# Patient Record
Sex: Female | Born: 1985 | Race: White | Hispanic: No | Marital: Single | Smoking: Never smoker
Health system: Southern US, Community
[De-identification: ages and names within clinical notes are randomized; demographics above are authoritative.]

## PROBLEM LIST (undated history)

## (undated) ENCOUNTER — Inpatient Hospital Stay (HOSPITAL_COMMUNITY): Payer: Self-pay

## (undated) ENCOUNTER — Emergency Department (HOSPITAL_COMMUNITY)

## (undated) ENCOUNTER — Inpatient Hospital Stay: Payer: Self-pay

## (undated) DIAGNOSIS — G2581 Restless legs syndrome: Secondary | ICD-10-CM

## (undated) DIAGNOSIS — Z62810 Personal history of physical and sexual abuse in childhood: Secondary | ICD-10-CM

## (undated) DIAGNOSIS — Z8751 Personal history of pre-term labor: Secondary | ICD-10-CM

## (undated) HISTORY — DX: Personal history of pre-term labor: Z87.51

## (undated) HISTORY — PX: TONSILLECTOMY: SUR1361

## (undated) HISTORY — PX: WISDOM TOOTH EXTRACTION: SHX21

---
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
## 2004-07-04 ENCOUNTER — Observation Stay: Payer: Self-pay | Admitting: Unknown Physician Specialty

## 2004-08-10 ENCOUNTER — Observation Stay: Payer: Self-pay | Admitting: Obstetrics & Gynecology

## 2004-10-15 ENCOUNTER — Other Ambulatory Visit: Payer: Self-pay

## 2004-10-15 ENCOUNTER — Emergency Department: Payer: Self-pay | Admitting: Emergency Medicine

## 2004-11-07 ENCOUNTER — Emergency Department: Payer: Self-pay | Admitting: Emergency Medicine

## 2006-11-16 ENCOUNTER — Emergency Department: Payer: Self-pay | Admitting: Emergency Medicine

## 2008-04-16 ENCOUNTER — Emergency Department: Payer: Self-pay | Admitting: Emergency Medicine

## 2009-01-02 ENCOUNTER — Emergency Department: Payer: Self-pay | Admitting: Emergency Medicine

## 2009-05-14 ENCOUNTER — Emergency Department: Payer: Self-pay | Admitting: Emergency Medicine

## 2010-04-19 ENCOUNTER — Encounter: Payer: Self-pay | Admitting: Obstetrics and Gynecology

## 2010-05-21 ENCOUNTER — Observation Stay: Payer: Self-pay | Admitting: Obstetrics and Gynecology

## 2010-05-27 ENCOUNTER — Observation Stay: Payer: Self-pay | Admitting: Obstetrics and Gynecology

## 2010-06-04 ENCOUNTER — Observation Stay: Payer: Self-pay

## 2010-06-05 ENCOUNTER — Ambulatory Visit: Payer: Self-pay

## 2010-06-12 ENCOUNTER — Observation Stay: Payer: Self-pay

## 2010-06-28 ENCOUNTER — Observation Stay: Payer: Self-pay | Admitting: Emergency Medicine

## 2010-07-16 ENCOUNTER — Observation Stay: Payer: Self-pay | Admitting: Obstetrics and Gynecology

## 2010-07-22 ENCOUNTER — Inpatient Hospital Stay: Payer: Self-pay

## 2011-02-05 ENCOUNTER — Emergency Department: Payer: Self-pay | Admitting: *Deleted

## 2012-03-04 ENCOUNTER — Emergency Department: Payer: Self-pay | Admitting: Emergency Medicine

## 2012-08-18 ENCOUNTER — Emergency Department: Payer: Self-pay | Admitting: Emergency Medicine

## 2012-10-05 ENCOUNTER — Emergency Department: Payer: Self-pay | Admitting: Emergency Medicine

## 2013-05-06 ENCOUNTER — Emergency Department: Payer: Self-pay | Admitting: Emergency Medicine

## 2014-01-17 HISTORY — PX: BRONCHOSCOPY: SUR163

## 2014-03-27 DIAGNOSIS — Z8659 Personal history of other mental and behavioral disorders: Secondary | ICD-10-CM | POA: Insufficient documentation

## 2014-09-18 ENCOUNTER — Emergency Department
Admission: EM | Admit: 2014-09-18 | Discharge: 2014-09-18 | Disposition: A | Discharge status: Home / Self Care | Payer: Medicaid Other | Attending: Emergency Medicine | Admitting: Emergency Medicine

## 2014-09-18 ENCOUNTER — Emergency Department: Payer: Medicaid Other

## 2014-09-18 ENCOUNTER — Encounter: Payer: Self-pay | Admitting: *Deleted

## 2014-09-18 DIAGNOSIS — M542 Cervicalgia: Secondary | ICD-10-CM | POA: Diagnosis present

## 2014-09-18 DIAGNOSIS — M436 Torticollis: Secondary | ICD-10-CM | POA: Diagnosis not present

## 2014-09-18 DIAGNOSIS — M545 Low back pain: Secondary | ICD-10-CM | POA: Insufficient documentation

## 2014-09-18 DIAGNOSIS — M546 Pain in thoracic spine: Secondary | ICD-10-CM | POA: Insufficient documentation

## 2014-09-18 DIAGNOSIS — G8929 Other chronic pain: Secondary | ICD-10-CM | POA: Insufficient documentation

## 2014-09-18 MED ORDER — METHYLPREDNISOLONE 4 MG PO TBPK: ORAL_TABLET | ORAL | Status: DC

## 2014-09-18 MED ORDER — DEXAMETHASONE SODIUM PHOSPHATE 10 MG/ML IJ SOLN
10.0000 mg | Freq: Once | INTRAMUSCULAR | Status: AC
  Administered 2014-09-18: 10 mg via INTRAMUSCULAR
  Filled 2014-09-18: qty 1

## 2014-09-18 MED ORDER — CYCLOBENZAPRINE HCL 10 MG PO TABS: 10.0000 mg | ORAL_TABLET | Freq: Three times a day (TID) | ORAL | Status: DC | PRN

## 2014-09-18 MED ORDER — ORPHENADRINE CITRATE 30 MG/ML IJ SOLN
60.0000 mg | Freq: Two times a day (BID) | INTRAMUSCULAR | Status: DC
  Administered 2014-09-18: 60 mg via INTRAMUSCULAR
  Filled 2014-09-18: qty 2

## 2014-09-18 MED ORDER — TRAMADOL HCL 50 MG PO TABS: 50.0000 mg | ORAL_TABLET | Freq: Four times a day (QID) | ORAL | Status: DC | PRN

## 2014-09-18 NOTE — ED Notes (Signed)
Pt has neck and upper back pain.  Sx began  3 months ago. No known injury .

## 2014-09-18 NOTE — ED Provider Notes (Signed)
[facility] Emergency Department Provider Note  ____________________________________________  Time seen: Approximately 4:19 PM  I have reviewed the triage vital signs and the nursing notes.   HISTORY  Chief Complaint Torticollis and Back Pain    HPI Lindsay Joseph is a 29 y.o. female patient complaining of 3 months of neck and upper back pain. Patient denies any history of injury. Patient also relates that she's have chronic low back pain and will be seen in the pain clinic on 10/02/2014. Patient states she has decreased range of motion with left lateral movement of the neck. Patient has full right lateral movements of the neck. Patient states that she's had numbness which she all the arms up above her head for extended period of time. Patient denies any loss of strength on upper extremities. Patient is rating the pain as 8/10. Patient stated no palliative measures for this complaint.   No past medical history on file.  There are no active problems to display for this patient.   No past surgical history on file.  Current Outpatient Rx  Name  Route  Sig  Dispense  Refill  . cyclobenzaprine (FLEXERIL) 10 MG tablet   Oral   Take 1 tablet (10 mg total) by mouth every 8 (eight) hours as needed for muscle spasms.   15 tablet   0   . methylPREDNISolone (MEDROL DOSEPAK) 4 MG TBPK tablet      Take Tapered dose as directed   21 tablet   0   . traMADol (ULTRAM) 50 MG tablet   Oral   Take 1 tablet (50 mg total) by mouth every 6 (six) hours as needed for moderate pain.   12 tablet   0     Allergies Review of patient's allergies indicates no known allergies.  No family history on file.  Social History Social History  Substance Use Topics  . Smoking status: Never Smoker   . Smokeless tobacco: Not on file  . Alcohol Use: No    Review of Systems Constitutional: No fever/chills Eyes: No visual changes. ENT: No sore throat. Cardiovascular:  Denies chest pain. Respiratory: Denies shortness of breath. Gastrointestinal: No abdominal pain.  No nausea, no vomiting.  No diarrhea.  No constipation. Genitourinary: Negative for dysuria. Musculoskeletal: Neck and upper back pain.  Skin: Negative for rash. Neurological: Negative for headaches, patient states she gets bilateral upper arm numbness when hands place overhead for extended period of time. 10-point ROS otherwise negative.  ____________________________________________   PHYSICAL EXAM:  VITAL SIGNS: ED Triage Vitals  Enc Vitals Group     BP 09/18/14 1556 131/89 mmHg     Pulse Rate 09/18/14 1556 64     Resp 09/18/14 1556 20     Temp 09/18/14 1556 97.7 F (36.5 C)     Temp Source 09/18/14 1552 Oral     SpO2 09/18/14 1556 97 %     Weight 09/18/14 1556 174 lb (78.926 kg)     Height 09/18/14 1556 5\' 4"  (1.626 m)     Head Cir --      Peak Flow --      Pain Score 09/18/14 1603 8     Pain Loc --      Pain Edu? --      Excl. in GC? --     Constitutional: Alert and oriented. Well appearing and in no acute distress. Eyes: Conjunctivae are normal. PERRL. EOMI. Head: Atraumatic. Nose: No congestion/rhinnorhea. Mouth/Throat: Mucous membranes are moist.  Oropharynx  non-erythematous. Neck: No stridor. No cervical spine tenderness to palpation. Hematological/Lymphatic/Immunilogical: No cervical lymphadenopathy. Cardiovascular: Normal rate, regular rhythm. Grossly normal heart sounds.  Good peripheral circulation. Respiratory: Normal respiratory effort.  No retractions. Lungs CTAB. Gastrointestinal: Soft and nontender. No distention. No abdominal bruits. No CVA tenderness. Musculoskeletal: No lower extremity tenderness nor edema.  No joint effusions. Patient has some moderate guarding palpation of the superior bilateral scapular muscle areas. Neurologic:  Normal speech and language. No gross focal neurologic deficits are appreciated. No gait instability. Skin:  Skin is warm,  dry and intact. No rash noted. Psychiatric: Mood and affect are normal. Speech and behavior are normal.  ____________________________________________   LABS (all labs ordered are listed, but only abnormal results are displayed)  Labs Reviewed - No data to display ____________________________________________  EKG   ____________________________________________  RADIOLOGY  No acute findings   there was straightening of  normal cervical lordosis .I, Joni Reining, personally viewed and evaluated these images (plain radiographs) as part of my medical decision making.   ____________________________________________   PROCEDURES  Procedure(s) performed: None  Critical Care performed: No  ____________________________________________   INITIAL IMPRESSION / ASSESSMENT AND PLAN / ED COURSE  Pertinent labs & imaging results that were available during my care of the patient were reviewed by me and considered in my medical decision making (see chart for details).  Cervical strain. Discussed negative findings x-ray with patient. Patient will be given a prescription for prednisone, Flexeril, and tramadol. ____________________________________________   FINAL CLINICAL IMPRESSION(S) / ED DIAGNOSES  Final diagnoses:  Torticollis, acute      Joni Reining, PA-C 09/18/14 1712  Sharman Cheek, MD 09/18/14 2049

## 2015-06-09 ENCOUNTER — Emergency Department
Admission: EM | Admit: 2015-06-09 | Discharge: 2015-06-09 | Disposition: A | Discharge status: Home / Self Care | Payer: Medicaid Other | Attending: Emergency Medicine | Admitting: Emergency Medicine

## 2015-06-09 ENCOUNTER — Emergency Department: Payer: Medicaid Other

## 2015-06-09 ENCOUNTER — Encounter: Payer: Self-pay | Admitting: Emergency Medicine

## 2015-06-09 DIAGNOSIS — M62838 Other muscle spasm: Secondary | ICD-10-CM | POA: Insufficient documentation

## 2015-06-09 DIAGNOSIS — M542 Cervicalgia: Secondary | ICD-10-CM | POA: Diagnosis present

## 2015-06-09 MED ORDER — KETOROLAC TROMETHAMINE 30 MG/ML IJ SOLN
30.0000 mg | Freq: Once | INTRAMUSCULAR | Status: AC
  Administered 2015-06-09: 30 mg via INTRAMUSCULAR
  Filled 2015-06-09: qty 1

## 2015-06-09 MED ORDER — CYCLOBENZAPRINE HCL 10 MG PO TABS: 10.0000 mg | ORAL_TABLET | Freq: Three times a day (TID) | ORAL | Status: DC | PRN

## 2015-06-09 MED ORDER — NAPROXEN 500 MG PO TABS: 500.0000 mg | ORAL_TABLET | Freq: Two times a day (BID) | ORAL | Status: DC

## 2015-06-09 MED ORDER — TRAMADOL HCL 50 MG PO TABS: 50.0000 mg | ORAL_TABLET | Freq: Four times a day (QID) | ORAL | Status: DC | PRN

## 2015-06-09 NOTE — ED Provider Notes (Signed)
[facility] Emergency Department Provider Note  ____________________________________________  Time seen: Approximately 5:12 PM  I have reviewed the triage vital signs and the nursing notes.   HISTORY  Chief Complaint Neck Pain    HPI Lindsay Joseph is a 30 y.o. female , NAD, presents to the emergency department with four-day history of neck pain. States she was at care once on Friday riding rides with her toddler. States she turned to look at her child and then turn forward and noted her neck strained. Denies any injuries, traumas, falls. Has been taking Tylenol and ibuprofen over-the-counter without alleviation of pain. States her neck feels tight and stiff. Has not had any numbness, weakness, tingling. Does feel the pain is traveling down into her upper back at times. Says it is difficult to sleep at night due to the pain. Has not noted any rashes or skin sores.   History reviewed. No pertinent past medical history.  There are no active problems to display for this patient.   History reviewed. No pertinent past surgical history.  Current Outpatient Rx  Name  Route  Sig  Dispense  Refill  . cyclobenzaprine (FLEXERIL) 10 MG tablet   Oral   Take 1 tablet (10 mg total) by mouth 3 (three) times daily as needed for muscle spasms.   21 tablet   0   . naproxen (NAPROSYN) 500 MG tablet   Oral   Take 1 tablet (500 mg total) by mouth 2 (two) times daily with a meal.   14 tablet   0   . traMADol (ULTRAM) 50 MG tablet   Oral   Take 1 tablet (50 mg total) by mouth every 6 (six) hours as needed.   10 tablet   0     Allergies Review of patient's allergies indicates no known allergies.  No family history on file.  Social History Social History  Substance Use Topics  . Smoking status: Never Smoker   . Smokeless tobacco: None  . Alcohol Use: No     Review of Systems  Constitutional: No fatigue Eyes: No visual changes.  Cardiovascular: No chest  pain. Respiratory:  No shortness of breath. No wheezing.  Gastrointestinal: No abdominal pain.  No nausea, vomiting.   Musculoskeletal: Positive neck and upper back pain. Negative for lower back pain. No extremity pain.  Skin: Negative for rash, bruising, redness, swelling, skin sores. Neurological: Negative for headaches, focal weakness or numbness. No tingling. 10-point ROS otherwise negative.  ____________________________________________   PHYSICAL EXAM:  VITAL SIGNS: ED Triage Vitals  Enc Vitals Group     BP 06/09/15 1640 116/59 mmHg     Pulse Rate 06/09/15 1640 73     Resp 06/09/15 1640 18     Temp 06/09/15 1640 97.8 F (36.6 C)     Temp Source 06/09/15 1640 Oral     SpO2 06/09/15 1640 97 %     Weight 06/09/15 1640 172 lb (78.019 kg)     Height 06/09/15 1640  (1.626 m)     Head Cir --      Peak Flow --      Pain Score 06/09/15 1640 5     Pain Loc --      Pain Edu? --      Excl. in GC? --      Constitutional: Alert and oriented. Well appearing and in no acute distress. Eyes: Conjunctivae are normal. PERRL. EOMI without pain.  Head: Atraumatic. ENT:  Ears:       Nose: No congestion/rhinnorhea.      Mouth/Throat: Mucous membranes are moist.  Neck: No stridor. No cervical spine tenderness to palpation. Bilateral trapezial muscles with moderate spasm and tenderness to palpation. Full range of motion with minimal pain is noted with flexion, right lateral rotation. Decreased range of motion with extension and left lateral rotation due to pain.  Hematological/Lymphatic/Immunilogical: No cervical lymphadenopathy. Cardiovascular: Good peripheral circulation with 2+ pulses noted in bilateral upper extremities. Respiratory: Normal respiratory effort without tachypnea or retractions.  Neurologic:  Normal speech and language. No gross focal neurologic deficits are appreciated. Grip strength is equal in bilateral upper extremities. Skin:  Skin is warm, dry and intact. No  rash noted. Psychiatric: Mood and affect are normal. Speech and behavior are normal. Patient exhibits appropriate insight and judgement.   ____________________________________________   LABS  None ____________________________________________  EKG  None ____________________________________________  RADIOLOGY I have personally viewed and evaluated these images (plain radiographs) as part of my medical decision making, as well as reviewing the written report by the radiologist.  Dg Cervical Spine 2-3 Views  06/09/2015  CLINICAL DATA:  Worsening left-sided neck pain and stiffness. EXAM: CERVICAL SPINE - 2-3 VIEW COMPARISON:  09/18/2014 FINDINGS: There is no evidence of cervical spine fracture or prevertebral soft tissue swelling. There is straightening of the cervical lordosis similar to the prior exam. No other significant bone abnormalities are identified. IMPRESSION: Negative cervical spine radiographs. Straightening of the cervical lordosis, similar than on the prior radiograph. Electronically Signed   By: Ted Mcalpineobrinka  Dimitrova M.D.   On: 06/09/2015 17:32    ____________________________________________    PROCEDURES  Procedure(s) performed: None    Medications  ketorolac (TORADOL) 30 MG/ML injection 30 mg (30 mg Intramuscular Given 06/09/15 1742)     ____________________________________________   INITIAL IMPRESSION / ASSESSMENT AND PLAN / ED COURSE  Pertinent imaging results that were available during my care of the patient were reviewed by me and considered in my medical decision making (see chart for details).  Patient's diagnosis is consistent with muscle spasms of the neck. Patient will be discharged home with prescriptions for Flexeril, Naprosyn, Ultram to take as directed. [facility] or her primary care provider if symptoms persist past this treatment course. Patient is given ED precautions to return to the ED for any worsening  or new symptoms.      ____________________________________________  FINAL CLINICAL IMPRESSION(S) / ED DIAGNOSES  Final diagnoses:  Muscle spasms of neck      NEW MEDICATIONS STARTED DURING THIS VISIT:  New Prescriptions   CYCLOBENZAPRINE (FLEXERIL) 10 MG TABLET    Take 1 tablet (10 mg total) by mouth 3 (three) times daily as needed for muscle spasms.   NAPROXEN (NAPROSYN) 500 MG TABLET    Take 1 tablet (500 mg total) by mouth 2 (two) times daily with a meal.   TRAMADOL (ULTRAM) 50 MG TABLET    Take 1 tablet (50 mg total) by mouth every 6 (six) hours as needed.         Hope PigeonJami L Serra Younan, PA-C 06/09/15 1759  Rockne MenghiniAnne-Caroline Norman, MD 06/10/15 [phone]

## 2015-06-09 NOTE — ED Notes (Signed)
Patient presents to the ED with neck pain since Friday when she was [place] rides [place] [employer].  Patient states this has happened to her before but her pain usually goes away quickly and has not this time.

## 2015-06-09 NOTE — Discharge Instructions (Signed)
Heat Therapy Heat therapy can help ease sore, stiff, injured, and tight muscles and joints. Heat relaxes your muscles, which may help ease your pain. Heat therapy should only be used on old, pre-existing, or long-lasting (chronic) injuries. Do not use heat therapy unless told by your doctor. HOW TO USE HEAT THERAPY There are several different kinds of heat therapy, including:  Moist heat pack.  Warm water bath.  Hot water bottle.  Electric heating pad.  Heated gel pack.  Heated wrap.  Electric heating pad. GENERAL HEAT THERAPY RECOMMENDATIONS   Do not sleep while using heat therapy. Only use heat therapy while you are awake.  Your skin may turn pink while using heat therapy. Do not use heat therapy if your skin turns red.  Do not use heat therapy if you have new pain.  High heat or long exposure to heat can cause burns. Be careful when using heat therapy to avoid burning your skin.  Do not use heat therapy on areas of your skin that are already irritated, such as with a rash or sunburn. GET HELP IF:   You have blisters, redness, swelling (puffiness), or numbness.  You have new pain.  Your pain is worse. MAKE SURE YOU:  Understand these instructions.  Will watch your condition.  Will get help right away if you are not doing well or get worse.   This information is not intended to replace advice given to you by your health care provider. Make sure you discuss any questions you have with your health care provider.   Document Released: 03/28/2011 Document Revised: 01/24/2014 Document Reviewed: 02/26/2013 Elsevier Interactive Patient Education 2016 Elsevier Inc.   Muscle Cramps and Spasms    Muscle cramps and spasms occur when a muscle or muscles tighten and you have no control over this tightening (involuntary muscle contraction). They are a common problem and can develop in any muscle. The most common place is in the calf muscles of the leg. Both muscle cramps and  muscle spasms are involuntary muscle contractions, but they also have differences:  Muscle cramps are sporadic and painful. They may last a few seconds to a quarter of an hour. Muscle cramps are often more forceful and last longer than muscle spasms.  Muscle spasms may or may not be painful. They may also last just a few seconds or much longer. CAUSES  It is uncommon for cramps or spasms to be due to a serious underlying problem. In many cases, the cause of cramps or spasms is unknown. Some common causes are:  Overexertion.  Overuse from repetitive motions (doing the same thing over and over).  Remaining in a certain position for a long period of time.  Improper preparation, form, or technique while performing a sport or activity.  Dehydration.  Injury.  Side effects of some medicines.  Abnormally low levels of the salts and ions in your blood (electrolytes), especially potassium and calcium. This could happen if you are taking water pills (diuretics) or you are pregnant.  Some underlying medical problems can make it more likely to develop cramps or spasms. These include, but are not limited to:  Diabetes.  Parkinson disease.  Hormone disorders, such as thyroid problems.  Alcohol abuse.  Diseases specific to muscles, joints, and bones.  Blood vessel disease where not enough blood is getting to the muscles.  HOME CARE INSTRUCTIONS  Stay well hydrated. Drink enough water and fluids to keep your urine clear or pale yellow.  It may be helpful  to massage, stretch, and relax the affected muscle.  For tight or tense muscles, use a warm towel, heating pad, or hot shower water directed to the affected area.  If you are sore or have pain after a cramp or spasm, applying ice to the affected area may relieve discomfort.  Put ice in a plastic bag.  Place a towel between your skin and the bag.  Leave the ice on for 15-20 minutes, 03-04 times a day. Medicines used to treat a known cause of cramps or  spasms may help reduce their frequency or severity. Only take over-the-counter or prescription medicines as directed by your caregiver. SEEK MEDICAL CARE IF:  Your cramps or spasms get more severe, more frequent, or do not improve over time.  MAKE SURE YOU:  Understand these instructions.  Will watch your condition.  Will get help right away if you are not doing well or get worse. This information is not intended to replace advice given to you by your health care provider. Make sure you discuss any questions you have with your health care provider.  Document Released: 06/25/2001 Document Revised: 04/30/2012 Document Reviewed: 12/21/2011  Elsevier Interactive Patient Education [employer]

## 2015-06-09 NOTE — ED Notes (Signed)
See triage note   Having neck pain since Friday  Denies any falls or trauma. States pain started after [place] [employer]

## 2015-06-16 ENCOUNTER — Other Ambulatory Visit: Payer: Self-pay | Admitting: Advanced Practice Midwife

## 2015-06-16 DIAGNOSIS — N61 Mastitis without abscess: Secondary | ICD-10-CM

## 2015-06-23 ENCOUNTER — Ambulatory Visit: Admission: RE | Admit: 2015-06-23 | Payer: Medicaid Other | Source: Ambulatory Visit

## 2015-06-23 ENCOUNTER — Other Ambulatory Visit: Payer: Medicaid Other

## 2016-04-05 LAB — HM HIV SCREENING LAB: HM HIV Screening: NEGATIVE

## 2016-04-05 LAB — HM PAP SMEAR: HM Pap smear: NEGATIVE

## 2016-09-15 ENCOUNTER — Encounter: Payer: Self-pay | Admitting: Advanced Practice Midwife

## 2016-09-15 ENCOUNTER — Ambulatory Visit (INDEPENDENT_AMBULATORY_CARE_PROVIDER_SITE_OTHER): Payer: Medicaid Other | Admitting: Advanced Practice Midwife

## 2016-09-15 VITALS — BP 116/70 | Wt 165.0 lb

## 2016-09-15 DIAGNOSIS — Z124 Encounter for screening for malignant neoplasm of cervix: Secondary | ICD-10-CM

## 2016-09-15 DIAGNOSIS — O0990 Supervision of high risk pregnancy, unspecified, unspecified trimester: Secondary | ICD-10-CM | POA: Diagnosis not present

## 2016-09-15 DIAGNOSIS — Z113 Encounter for screening for infections with a predominantly sexual mode of transmission: Secondary | ICD-10-CM

## 2016-09-15 NOTE — Progress Notes (Signed)
NOB today.  

## 2016-09-16 LAB — RPR+RH+ABO+RUB AB+AB SCR+CB...
ANTIBODY SCREEN: NEGATIVE
HEMATOCRIT: 36 % (ref 34.0–46.6)
HIV Screen 4th Generation wRfx: NONREACTIVE
Hemoglobin: 12 g/dL (ref 11.1–15.9)
Hepatitis B Surface Ag: NEGATIVE
MCH: 28.2 pg (ref 26.6–33.0)
MCHC: 33.3 g/dL (ref 31.5–35.7)
MCV: 85 fL (ref 79–97)
Platelets: 200 10*3/uL (ref 150–379)
RBC: 4.26 x10E6/uL (ref 3.77–5.28)
RDW: 13.7 % (ref 12.3–15.4)
RH TYPE: POSITIVE
RPR Ser Ql: NONREACTIVE
Rubella Antibodies, IGG: 1.42 index (ref >0.99)
Varicella zoster IgG: 1331 index (ref >165)
WBC: 6.6 10*3/uL (ref 3.4–10.8)

## 2016-09-16 NOTE — Patient Instructions (Signed)

## 2016-09-16 NOTE — Progress Notes (Signed)
New Obstetric Patient H&P    Chief Complaint: "Desires prenatal care"   History of Present Illness: Patient is a 31 y.o. [id number] Not Hispanic or Latino female, LMP uncertain presents with amenorrhea and positive home pregnancy test. Based on her date of conception, her EDD is Estimated Date of Delivery: 04/25/2017. and her EGA is 10854w3d. Cycles are 14. days, regular, and occur approximately every : 28 days. The patient thinks she has never had a PAP smear. She has never initiated prenatal care prior to 33 weeks.   She had a urine pregnancy test which was positive 3 or 4 week(s)  ago. Her last menstrual period was normal and lasted for  2 week(s). Since her LMP she claims she has experienced breast tenderness, fatigue, nausea. She denies vaginal bleeding. Her past medical history is noncontributory. Her prior pregnancies are notable for preterm labor and delivery. Discussion of progesterone supplement. Patient requests 17P beginning at 16 weeks.  Since her LMP, she admits to the use of tobacco products  no She claims she has gained   5 pounds since the start of her pregnancy.  There are cats in the home in the home  no  She admits close contact with children on a regular basis  yes  She has had chicken pox in the past yes She has had Tuberculosis exposures, symptoms, or previously tested positive for TB   no Current or past history of domestic violence. no  Genetic Screening/Teratology Counseling: (Includes patient, baby's father, or anyone in either family with:)   1. [facility]'s [facility]/= 3[facility]  no 2. Thalassemia ([place], [place], Mediterranean, or Asian background): MCV<80  no 3. Neural tube defect (meningomyelocele, spina bifida, anencephaly)  no 4. Congenital heart defect  no  5. Down syndrome  no 6. Tay-Sachs (Jewish, [place])  no 7. Canavan's Disease  no 8. Sickle cell disease or trait (African)  no  9. Hemophilia or other blood disorders  no  10. Muscular dystrophy  no    11. Cystic fibrosis  no  12. Huntington's Chorea  no  13. Mental retardation/autism  no 14. Other inherited genetic or chromosomal disorder  no 15. Maternal metabolic disorder (DM, PKU, etc)  no 16. Patient or FOB with a child with a birth defect not listed above no  16a. Patient or FOB with a birth defect themselves no 17. Recurrent pregnancy loss, or stillbirth  no  18. Any medications since LMP other than prenatal vitamins (include vitamins, supplements, OTC meds, drugs, alcohol)  no 19. Any other genetic/environmental exposure to discuss  no  Infection History:   1. Lives with someone with TB or TB exposed  no  2. Patient or partner has history of genital herpes  no 3. Rash or viral illness since LMP  no 4. History of STI (GC, CT, HPV, syphilis, HIV)  yes 5. History of recent travel :  no  Other pertinent information:  no    Review of Systems:10 point review of systems negative unless otherwise noted in HPI  Past Medical History:  History reviewed. No pertinent past medical history.  Past Surgical History:  Past Surgical History:  Procedure Laterality Date  . TONSILLECTOMY      Gynecologic History: Uncertain LMP  Obstetric History: [id number]  Family History:  Family History  Problem Relation Age of Onset  . Throat cancer Father     Social History:  Social History   Social History  . Marital status: Single  Spouse name: N/A  . Number of children: N/A  . Years of education: N/A   Occupational History  . Not on file.   Social History Main Topics  . Smoking status: Never Smoker  . Smokeless tobacco: Never Used  . Alcohol use No  . Drug use: No  . Sexual activity: Yes    Birth control/ protection: None   Other Topics Concern  . Not on file   Social History Narrative  . No narrative on file    Allergies:  No Known Allergies  Medications: Prior to Admission medications   Medication Sig Start Date End Date Taking? Authorizing Provider   cyclobenzaprine (FLEXERIL) 10 MG tablet Take 1 tablet (10 mg total) by mouth 3 (three) times daily as needed for muscle spasms. Patient not taking: Reported on 09/15/2016 06/09/15   Hagler, Jami L, PA-C  naproxen (NAPROSYN) 500 MG tablet Take 1 tablet (500 mg total) by mouth 2 (two) times daily with a meal. Patient not taking: Reported on 09/15/2016 06/09/15   Hagler, Jami L, PA-C  traMADol (ULTRAM) 50 MG tablet Take 1 tablet (50 mg total) by mouth every 6 (six) hours as needed. Patient not taking: Reported on 09/15/2016 06/09/15   Hope Pigeon, PA-C    Physical Exam Vitals: Blood pressure 116/70, weight 165 lb (74.8 kg), last menstrual period 08/02/2016.  General: NAD HEENT: normocephalic, anicteric Thyroid: no enlargement, no palpable nodules Pulmonary: No increased work of breathing, CTAB Cardiovascular: RRR, distal pulses 2+ Abdomen: NABS, soft, non-tender, non-distended.  Umbilicus without lesions.  No hepatomegaly, splenomegaly or masses palpable. No evidence of hernia  Genitourinary:  External: Normal external female genitalia.  Normal urethral meatus, normal  Bartholin's and Skene's glands.    Vagina: Normal vaginal mucosa, no evidence of prolapse.    Cervix: Grossly normal in appearance, no bleeding, no CMT  Uterus: Enlarged, mobile, normal contour.    Adnexa: ovaries non-enlarged, no adnexal masses  Rectal: deferred Extremities: no edema, erythema, or tenderness Neurologic: Grossly intact Psychiatric: mood appropriate, affect full   Assessment: 31 y.o. [id number] at Unknown presenting to initiate prenatal care  Plan: 1) Avoid alcoholic beverages. 2) Patient encouraged not to smoke.  3) Discontinue the use of all non-medicinal drugs and chemicals.  4) Take prenatal vitamins daily.  5) Nutrition, food safety (fish, cheese advisories, and high nitrite foods) and exercise discussed. 6) Hospital and practice style discussed with cross coverage system.  7) Genetic Screening, such  as with 1st Trimester Screening, cell free fetal DNA, AFP testing, and Ultrasound, as well as with amniocentesis and CVS as appropriate, is discussed with patient. At the conclusion of today's visit patient requested genetic testing 8) Patient is asked about travel to areas at risk for the Zika virus, and counseled to avoid travel and exposure to mosquitoes or sexual partners who may have themselves been exposed to the virus. Testing is discussed, and will be ordered as appropriate.  9) 17P beginning at 16 weeks  Tresea Mall, CNM

## 2016-09-17 LAB — URINE CULTURE

## 2016-09-22 LAB — IGP,CTNGTV,APT HPV,RFX16/18,45
CHLAMYDIA, NUC. ACID AMP: NEGATIVE
GONOCOCCUS, NUC. ACID AMP: NEGATIVE
HPV Aptima: NEGATIVE
PAP SMEAR COMMENT: 0
TRICH VAG BY NAA: NEGATIVE

## 2016-09-23 ENCOUNTER — Other Ambulatory Visit: Payer: Self-pay

## 2016-09-23 ENCOUNTER — Encounter: Payer: Self-pay | Admitting: Advanced Practice Midwife

## 2016-09-26 ENCOUNTER — Encounter: Payer: Self-pay | Admitting: Maternal Newborn

## 2016-09-26 ENCOUNTER — Ambulatory Visit (INDEPENDENT_AMBULATORY_CARE_PROVIDER_SITE_OTHER): Payer: Medicaid Other

## 2016-09-26 DIAGNOSIS — O0990 Supervision of high risk pregnancy, unspecified, unspecified trimester: Secondary | ICD-10-CM

## 2016-09-26 DIAGNOSIS — Z362 Encounter for other antenatal screening follow-up: Secondary | ICD-10-CM | POA: Diagnosis not present

## 2016-10-14 ENCOUNTER — Encounter: Payer: Self-pay | Admitting: *Deleted

## 2016-10-14 ENCOUNTER — Emergency Department
Admission: EM | Admit: 2016-10-14 | Discharge: 2016-10-14 | Disposition: A | Discharge status: Home / Self Care | Payer: Medicaid Other | Attending: Emergency Medicine | Admitting: Emergency Medicine

## 2016-10-14 ENCOUNTER — Emergency Department: Payer: Medicaid Other

## 2016-10-14 DIAGNOSIS — Z79899 Other long term (current) drug therapy: Secondary | ICD-10-CM | POA: Diagnosis not present

## 2016-10-14 DIAGNOSIS — R1084 Generalized abdominal pain: Secondary | ICD-10-CM | POA: Diagnosis present

## 2016-10-14 DIAGNOSIS — O9989 Other specified diseases and conditions complicating pregnancy, childbirth and the puerperium: Secondary | ICD-10-CM | POA: Insufficient documentation

## 2016-10-14 DIAGNOSIS — R102 Pelvic and perineal pain: Secondary | ICD-10-CM | POA: Insufficient documentation

## 2016-10-14 DIAGNOSIS — R109 Unspecified abdominal pain: Secondary | ICD-10-CM

## 2016-10-14 DIAGNOSIS — O26899 Other specified pregnancy related conditions, unspecified trimester: Secondary | ICD-10-CM

## 2016-10-14 LAB — COMPREHENSIVE METABOLIC PANEL
ALBUMIN: 3.4 g/dL — AB (ref 3.5–5.0)
ALK PHOS: 27 U/L — AB (ref 38–126)
ALT: 8 U/L — ABNORMAL LOW (ref 14–54)
ANION GAP: 9 (ref 5–15)
AST: 13 U/L — AB (ref 15–41)
BILIRUBIN TOTAL: 0.3 mg/dL (ref 0.3–1.2)
BUN: 11 mg/dL (ref 6–20)
CALCIUM: 9 mg/dL (ref 8.9–10.3)
CHLORIDE: 103 mmol/L (ref 101–111)
CO2: 23 mmol/L (ref 22–32)
CREATININE: 0.63 mg/dL (ref 0.44–1.00)
Glucose, Bld: 87 mg/dL (ref 65–99)
Potassium: 3.6 mmol/L (ref 3.5–5.1)
SODIUM: 135 mmol/L (ref 135–145)
Total Protein: 6.5 g/dL (ref 6.5–8.1)

## 2016-10-14 LAB — URINALYSIS, COMPLETE (UACMP) WITH MICROSCOPIC
BACTERIA UA: NONE SEEN
Bilirubin Urine: NEGATIVE
Glucose, UA: NEGATIVE mg/dL
Ketones, ur: NEGATIVE mg/dL
Nitrite: NEGATIVE
PH: 5 (ref 5.0–8.0)
Protein, ur: NEGATIVE mg/dL
SPECIFIC GRAVITY, URINE: 1.021 (ref 1.005–1.030)

## 2016-10-14 LAB — CBC WITH DIFFERENTIAL/PLATELET
BASOS PCT: 1 %
Basophils Absolute: 0 10*3/uL (ref 0–0.1)
EOS ABS: 0 10*3/uL (ref 0–0.7)
Eosinophils Relative: 1 %
HCT: 35.3 % (ref 35.0–47.0)
HEMOGLOBIN: 12.2 g/dL (ref 12.0–16.0)
LYMPHS ABS: 2.1 10*3/uL (ref 1.0–3.6)
Lymphocytes Relative: 27 %
MCH: 29.4 pg (ref 26.0–34.0)
MCHC: 34.7 g/dL (ref 32.0–36.0)
MCV: 84.6 fL (ref 80.0–100.0)
Monocytes Absolute: 0.6 10*3/uL (ref 0.2–0.9)
Monocytes Relative: 8 %
NEUTROS PCT: 63 %
Neutro Abs: 5 10*3/uL (ref 1.4–6.5)
Platelets: 194 10*3/uL (ref 150–440)
RBC: 4.17 MIL/uL (ref 3.80–5.20)
RDW: 12.9 % (ref 11.5–14.5)
WBC: 7.8 10*3/uL (ref 3.6–11.0)

## 2016-10-14 LAB — WET PREP, GENITAL
Clue Cells Wet Prep HPF POC: NONE SEEN
Sperm: NONE SEEN
Trich, Wet Prep: NONE SEEN
Yeast Wet Prep HPF POC: NONE SEEN

## 2016-10-14 LAB — POCT PREGNANCY, URINE: PREG TEST UR: POSITIVE — AB

## 2016-10-14 LAB — CHLAMYDIA/NGC RT PCR (ARMC ONLY)
CHLAMYDIA TR: NOT DETECTED
N GONORRHOEAE: NOT DETECTED

## 2016-10-14 LAB — HCG, QUANTITATIVE, PREGNANCY: HCG, BETA CHAIN, QUANT, S: 68866 m[IU]/mL — AB (ref <5)

## 2016-10-14 MED ORDER — SODIUM CHLORIDE 0.9 % IV BOLUS (SEPSIS)
1000.0000 mL | Freq: Once | INTRAVENOUS | Status: AC
  Administered 2016-10-14: 1000 mL via INTRAVENOUS

## 2016-10-14 NOTE — ED Notes (Signed)
Pt presents at [redacted] weeks pregnant with abdominal cramping that feels "like a charley horse." Pt state that she has difficulty with pregnancy in general (this is her 8th pregnancy; miscarriage in December), usually starting to dilate at 20 weeks. Pt alert & oriented with NAD noted.

## 2016-10-14 NOTE — ED Triage Notes (Signed)
Pt is 13 weeks pregnancy with pregnancy #8, pt complains of abdominal cramping starting yesterday, pt denies vaginal bleeding

## 2016-10-14 NOTE — ED Provider Notes (Signed)
[facility] Emergency Department Provider Note ____________________________________________   I have reviewed the triage vital signs and the triage nursing note.  HISTORY  Chief Complaint Abdominal Pain   Historian Patient  HPI Lindsay Joseph is a 31 y.o. female presents [redacted] weeks pregnant, following with [facility], states abdominal/pelvic cramping overnight.  No vaginal bleeding or vaginal fluid loss. No upper abdominal pain. States that when she urinates at the and it feels like it hurts, but not previously diagnosed with urinary tract infection.  Prior pregnancy problems of early cervical dilation around 20 weeks.  Pain is moderate. States that Tylenol never works for her.  she's had 8 pregnancies. She has had four premature births. She had one term birth at 20 weeks which did not live. An she's had 2 additional miscarriages around 8 weeks.    History reviewed. No pertinent past medical history.  There are no active problems to display for this patient.   Past Surgical History:  Procedure Laterality Date  . TONSILLECTOMY      Prior to Admission medications   Medication Sig Start Date End Date Taking? Authorizing Provider  cyclobenzaprine (FLEXERIL) 10 MG tablet Take 1 tablet (10 mg total) by mouth 3 (three) times daily as needed for muscle spasms. Patient not taking: Reported on 09/15/2016 06/09/15   Hagler, Jami L, PA-C  naproxen (NAPROSYN) 500 MG tablet Take 1 tablet (500 mg total) by mouth 2 (two) times daily with a meal. Patient not taking: Reported on 09/15/2016 06/09/15   Hagler, Jami L, PA-C  traMADol (ULTRAM) 50 MG tablet Take 1 tablet (50 mg total) by mouth every 6 (six) hours as needed. Patient not taking: Reported on 09/15/2016 06/09/15   Hagler, Jami L, PA-C    No Known Allergies  Family History  Problem Relation Age of Onset  . Throat cancer Father     Social History Social History  Substance Use Topics  . Smoking status:  Never Smoker  . Smokeless tobacco: Never Used  . Alcohol use No    Review of Systems  Constitutional: Negative for fever. Eyes: Negative for visual changes. ENT: Negative for sore throat. Cardiovascular: Negative for chest pain. Respiratory: Negative for shortness of breath. Gastrointestinal: Negative for vomiting and diarrhea. Genitourinary: Negative for dysuria. Musculoskeletal: Positive for back pain. Skin: Negative for rash. Neurological: Negative for headache.  ____________________________________________   PHYSICAL EXAM:  VITAL SIGNS: ED Triage Vitals  Enc Vitals Group     BP 10/14/16 0743 117/73     Pulse Rate 10/14/16 0743 66     Resp 10/14/16 0743 18     Temp 10/14/16 0743 97.6 F (36.4 C)     Temp Source 10/14/16 0743 Oral     SpO2 10/14/16 0743 99 %     Weight 10/14/16 0743 165 lb (74.8 kg)     Height 10/14/16 0743  (1.626 m)     Head Circumference --      Peak Flow --      Pain Score 10/14/16 0741 4     Pain Loc --      Pain Edu? --      Excl. in GC? --      Constitutional: Alert and oriented. Well appearing and in no distress. HEENT   Head: Normocephalic and atraumatic.      Eyes: Conjunctivae are normal. Pupils equal and round.       Ears:         Nose: No congestion/rhinnorhea.  Mouth/Throat: Mucous membranes are moist.   Neck: No stridor. Cardiovascular/Chest: Normal rate, regular rhythm.  No murmurs, rubs, or gallops. Respiratory: Normal respiratory effort without tachypnea nor retractions. Breath sounds are clear and equal bilaterally. No wheezes/rales/rhonchi. Gastrointestinal: Soft. No distention, no guarding, no rebound. Mild tenderness lower abdomen.  Uterus at edge of pelvis.  No focal rlq pain.  No upper abdominal pain.  Genitourinary/rectal: closed cervix.  No cervicitis or mass on bimanual.  No bleeding, no discharge. Musculoskeletal: Nontender with normal range of motion in all extremities. No joint effusions.  No lower  extremity tenderness.  No edema. Neurologic:  Normal speech and language. No gross or focal neurologic deficits are appreciated. Skin:  Skin is warm, dry and intact. No rash noted. Psychiatric: Mood and affect are normal. Speech and behavior are normal. Patient exhibits appropriate insight and judgment.   ____________________________________________  LABS (pertinent positives/negatives) I, Governor Rooks, MD the attending physician have reviewed the labs noted below.  Labs Reviewed  WET PREP, GENITAL - Abnormal; Notable for the following:       Result Value   WBC, Wet Prep HPF POC MODERATE (*)    All other components within normal limits  URINALYSIS, COMPLETE (UACMP) WITH MICROSCOPIC - Abnormal; Notable for the following:    Color, Urine YELLOW (*)    APPearance HAZY (*)    Hgb urine dipstick SMALL (*)    Leukocytes, UA TRACE (*)    Squamous Epithelial / LPF 6-30 (*)    All other components within normal limits  COMPREHENSIVE METABOLIC PANEL - Abnormal; Notable for the following:    Albumin 3.4 (*)    AST 13 (*)    ALT 8 (*)    Alkaline Phosphatase 27 (*)    All other components within normal limits  HCG, QUANTITATIVE, PREGNANCY - Abnormal; Notable for the following:    hCG, Beta Chain, Quant, [place] 21,308 (*)    All other components within normal limits  POCT PREGNANCY, URINE - Abnormal; Notable for the following:    Preg Test, Ur POSITIVE (*)    All other components within normal limits  CHLAMYDIA/NGC RT PCR ([facility] ONLY)  CBC WITH DIFFERENTIAL/PLATELET    ____________________________________________    EKG I, Governor Rooks, MD, the attending physician have personally viewed and interpreted all ECGs.  None ____________________________________________  RADIOLOGY All Xrays were viewed by me.  Imaging interpreted by Radiologist, and I, Governor Rooks, MD the attending physician have reviewed the radiologist interpretation noted below.  US ob and transvaginal:   FINDINGS: Intrauterine gestational sac: Single  Yolk sac:  Not Visualized.  Embryo:  Visualized.  Cardiac Activity: Visualized.  Heart Rate: 157 bpm  CRL:   64  mm   12 w 5 d                  [place] EDC: 04/23/2017  Subchorionic hemorrhage:  None visualized.  Maternal uterus/adnexae: Normal appearance of both ovaries. No mass or abnormal free fluid identified.  IMPRESSION: Single living IUP with assigned gestational age is currently 12 weeks 3 days. Appropriate fetal growth.  No significant maternal uterine or adnexal abnormality identified.  __________________________________________  PROCEDURES  Procedure(s) performed: None  Critical Care performed: None  ____________________________________________  No current facility-administered medications on file prior to encounter.    Current Outpatient Prescriptions on File Prior to Encounter  Medication Sig Dispense Refill  . cyclobenzaprine (FLEXERIL) 10 MG tablet Take 1 tablet (10 mg total) by mouth 3 (three) times daily as needed  for muscle spasms. (Patient not taking: Reported on 09/15/2016) 21 tablet 0  . naproxen (NAPROSYN) 500 MG tablet Take 1 tablet (500 mg total) by mouth 2 (two) times daily with a meal. (Patient not taking: Reported on 09/15/2016) 14 tablet 0  . traMADol (ULTRAM) 50 MG tablet Take 1 tablet (50 mg total) by mouth every 6 (six) hours as needed. (Patient not taking: Reported on 09/15/2016) 10 tablet 0    ____________________________________________  ED COURSE / ASSESSMENT AND PLAN  Pertinent labs & imaging results that were available during my care of the patient were reviewed by me and considered in my medical decision making (see chart for details).   Lindsay Joseph is here with pelvic cramping, soft abdomen on exam. She is mainly concerned about the pregnancy given her previous issues with miscarriages and premature births. She states that normally her trouble starts with incompetent cervix  around 20 weeks.  On pelvic exam her cervix is closed. Ultrasound is reassuring.  Laboratory studies are reassuring. I don't have a suspicion for other emergency intra-abdominal cause at this point time. We discussed reassuring exam and evaluation and she does have follow-up with an OB/GYN.   DIFFERENTIAL DIAGNOSIS: Differential diagnosis includes, but is not limited to, ovarian cyst, ovarian torsion, acute appendicitis, diverticulitis, urinary tract infection/pyelonephritis, endometriosis, bowel obstruction, colitis, renal colic, gastroenteritis, hernia, pregnancy related pain including threatened miscarriage, incomplete miscarriage, subchorionic hemorrhage/hematoma, etc.  CONSULTATIONS:   None   Patient / Family / Caregiver informed of clinical course, medical decision-making process, and agree with plan.   I discussed return precautions, follow-up instructions, and discharge instructions with patient and/or family.  Discharge Instructions : You were evaluated for lower abdominal cramping and pain, and although no certain cause was found, your exam and evaluation are overall reassuring in the emergency department today.  Return to the emergency department for any vaginal bleeding, new or worsening abdominal pain, fever, or any other symptoms concerning to you.  ___________________________________________   FINAL CLINICAL IMPRESSION(S) / ED DIAGNOSES   Final diagnoses:  Pelvic cramping              Note: This dictation was prepared with Dragon dictation. Any transcriptional errors that result from this process are unintentional    Governor Rooks, MD 10/14/16 1148

## 2016-10-14 NOTE — Discharge Instructions (Signed)
You were evaluated for lower abdominal cramping and pain, and although no certain cause was found, your exam and evaluation are overall reassuring in the emergency department today.  Return to the emergency department for any vaginal bleeding, new or worsening abdominal pain, fever, or any other symptoms concerning to you.

## 2016-10-20 ENCOUNTER — Telehealth: Payer: Self-pay

## 2016-10-20 NOTE — Telephone Encounter (Signed)
CM made telephone contact with pt today.  CM introduced self and purpose of contact.  CM discussed with pt the importance keeping and scheduling routine appointments with her [facility] Provider office.  Pt discussed that "I have a lot of going on right now, I work third shift, and I am in the process of moving."  Pt reports that her new address is [address] in [place].  Pt reports that she does not get much sleep since working third shift.  Pt appears to be disengaged in a conversation.  CM offered assistance to pt regarding her needs through the Pregnancy Care Management Program.  CM asked pt when would be a good time for this cm to meet with pt face to face.  Pt responded, "I don't need and don't want your services right now."  CM encouraged pt again to schedule a routine OB appointment and encouraged pt to contact this cm if services are needed.  Pt. Hung up the phone.

## 2016-11-08 ENCOUNTER — Encounter: Payer: Medicaid Other | Admitting: Obstetrics and Gynecology

## 2016-11-10 ENCOUNTER — Ambulatory Visit (INDEPENDENT_AMBULATORY_CARE_PROVIDER_SITE_OTHER): Payer: Medicaid Other | Admitting: Maternal Newborn

## 2016-11-10 ENCOUNTER — Encounter: Payer: Self-pay | Admitting: Maternal Newborn

## 2016-11-10 DIAGNOSIS — O2622 Pregnancy care for patient with recurrent pregnancy loss, second trimester: Secondary | ICD-10-CM

## 2016-11-10 DIAGNOSIS — O0990 Supervision of high risk pregnancy, unspecified, unspecified trimester: Secondary | ICD-10-CM | POA: Insufficient documentation

## 2016-11-10 NOTE — Progress Notes (Signed)
    Routine Prenatal Care Visit  Subjective  Lindsay Joseph is a 31 y.o. [id number] at 6655w2d being seen today for ongoing prenatal care.  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  ----------------------------------------------------------------------------------- Patient reports fatigue and tightness/soreness in lower abdomen and dizziness/faintness when moving from seated to standing position.   Vag. Bleeding: None. Denies leaking of fluid.  ----------------------------------------------------------------------------------- The following portions of the patient's history were reviewed and updated as appropriate: allergies, current medications, past family history, past medical history, past social history, past surgical history and problem list. Problem list updated.   Objective  Blood pressure (!) 102/54, weight 159 lb (72.1 kg), last menstrual period 08/02/2016. Pregravid weight Pregravid weight not on file Total Weight Gain Not found. Urinalysis: Urine Protein: Trace Urine Glucose: Negative  Fetal Status: Fetal Heart Rate (bpm): 150         General:  Alert, oriented and cooperative. Patient is in no acute distress.  Skin: Skin is warm and dry. No rash noted.   Cardiovascular: Normal heart rate noted  Respiratory: Normal respiratory effort, no problems with respiration noted  Abdomen: Soft, gravid, appropriate for gestational age. Pain/Pressure: Absent     Pelvic:  Cervical exam deferred        Extremities: Normal range of motion.     Mental Status: Normal mood and affect. Normal behavior. Normal judgment and thought content.     Assessment   31 y.o. [id number] at 9655w2d, EDD 04/25/2017 by Ultrasound presenting for routine prenatal visit.  Plan   pregnancy Problems (from 09/15/16 to present)    Problem Noted Resolved   High risk pregnancy due to recurrent  pregnancy loss 11/10/2016 by Oswaldo ConroySchmid, Gerold Sar Y, CNM No    Discussed postural hypotension vs. Anemia as causes for patient's symptoms; CBC drawn to check for anemia as she reports symptoms as daily and moderate severity.  Letter given for dentist to allow her to get tooth extractions.  Desires note for work to be excused for remainder of pregnancy due to sx and risk of PTL, will discuss with MD.  Preterm labor symptoms and general obstetric precautions including but not limited to vaginal bleeding, contractions, leaking of fluid and fetal movement were reviewed in detail with the patient.  Return in about 4 weeks (around 12/08/2016) for ROB.  Marcelyn BruinsJacelyn Shadoe Cryan, CNM 11/10/2016  5:13 PM

## 2016-11-11 ENCOUNTER — Other Ambulatory Visit: Payer: Self-pay | Admitting: Maternal Newborn

## 2016-11-11 ENCOUNTER — Telehealth: Payer: Self-pay | Admitting: Maternal Newborn

## 2016-11-11 DIAGNOSIS — O09212 Supervision of pregnancy with history of pre-term labor, second trimester: Secondary | ICD-10-CM

## 2016-11-11 LAB — CBC WITH DIFFERENTIAL
BASOS ABS: 0 10*3/uL (ref 0.0–0.2)
Basos: 0 %
EOS (ABSOLUTE): 0.1 10*3/uL (ref 0.0–0.4)
Eos: 1 %
Hematocrit: 34 % (ref 34.0–46.6)
Hemoglobin: 11.5 g/dL (ref 11.1–15.9)
IMMATURE GRANS (ABS): 0 10*3/uL (ref 0.0–0.1)
IMMATURE GRANULOCYTES: 0 %
LYMPHS: 26 %
Lymphocytes Absolute: 1.9 10*3/uL (ref 0.7–3.1)
MCH: 29.1 pg (ref 26.6–33.0)
MCHC: 33.8 g/dL (ref 31.5–35.7)
MCV: 86 fL (ref 79–97)
Monocytes Absolute: 0.5 10*3/uL (ref 0.1–0.9)
Monocytes: 7 %
NEUTROS PCT: 66 %
Neutrophils Absolute: 4.9 10*3/uL (ref 1.4–7.0)
RBC: 3.95 x10E6/uL (ref 3.77–5.28)
RDW: 14 % (ref 12.3–15.4)
WBC: 7.4 10*3/uL (ref 3.4–10.8)

## 2016-11-11 MED ORDER — HYDROXYPROGESTERONE CAPROATE 250 MG/ML IM OIL: 250.0000 mg | TOPICAL_OIL | INTRAMUSCULAR | 19 refills | Status: DC

## 2016-11-11 NOTE — Telephone Encounter (Signed)
Please advise 

## 2016-11-11 NOTE — Telephone Encounter (Signed)
Pt is calling About a note taking her out of work please advise.

## 2016-11-18 ENCOUNTER — Telehealth: Payer: Self-pay | Admitting: Obstetrics and Gynecology

## 2016-11-18 NOTE — Telephone Encounter (Signed)
Lvm for patient to call back to be schedule for TVUS at next appt.

## 2016-11-25 ENCOUNTER — Ambulatory Visit: Payer: Medicaid Other

## 2016-11-28 ENCOUNTER — Ambulatory Visit: Payer: Medicaid Other

## 2016-12-13 ENCOUNTER — Encounter: Payer: Medicaid Other | Admitting: Obstetrics and Gynecology

## 2016-12-13 ENCOUNTER — Ambulatory Visit (INDEPENDENT_AMBULATORY_CARE_PROVIDER_SITE_OTHER): Payer: Medicaid Other | Admitting: Maternal Newborn

## 2016-12-13 ENCOUNTER — Encounter: Payer: Self-pay | Admitting: Maternal Newborn

## 2016-12-13 VITALS — BP 100/60 | Wt 161.0 lb

## 2016-12-13 DIAGNOSIS — O2622 Pregnancy care for patient with recurrent pregnancy loss, second trimester: Secondary | ICD-10-CM

## 2016-12-13 DIAGNOSIS — R109 Unspecified abdominal pain: Secondary | ICD-10-CM

## 2016-12-13 DIAGNOSIS — O26899 Other specified pregnancy related conditions, unspecified trimester: Secondary | ICD-10-CM

## 2016-12-13 LAB — POCT URINALYSIS DIPSTICK
Bilirubin, UA: NEGATIVE
Blood, UA: NEGATIVE
Glucose, UA: NEGATIVE
Ketones, UA: NEGATIVE
LEUKOCYTES UA: NEGATIVE
NITRITE UA: NEGATIVE
PROTEIN UA: NEGATIVE
UROBILINOGEN UA: NEGATIVE U/dL — AB
pH, UA: 5 (ref 5.0–8.0)

## 2016-12-13 NOTE — Progress Notes (Signed)
    Prenatal Care - Problem Visit  Subjective  Lindsay Joseph is a 31 y.o. [id number] at 4615w0d being seen today for ongoing prenatal care.  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  ----------------------------------------------------------------------------------- Patient reports that she began having abdominal pain around 3 am with nausea and gas. She felt bloated and had sharp, intermittent pains in her stomach.  She had one episode of diarrhea and felt better afterwards. At this time, her stomach is still somewhat uncomfortable and feels tight, but seems to be improving. Contractions: Not present. Vag. Bleeding: None.   . Denies leaking of fluid.  ----------------------------------------------------------------------------------- The following portions of the patient's history were reviewed and updated as appropriate: allergies, current medications, past family history, past medical history, past social history, past surgical history and problem list. Problem list updated.   Objective  Blood pressure 100/60, weight 161 lb (73 kg), last menstrual period 08/02/2016. Pregravid weight Pregravid weight not on file Total Weight Gain Not found. Urinalysis:      Fetal Status: Fetal Heart Rate (bpm): 145         General:  Alert, oriented and cooperative. Patient is in no acute distress.  Skin: Skin is warm and dry. No rash noted.   Cardiovascular: Normal heart rate noted  Respiratory: Normal respiratory effort, no problems with respiration noted  Abdomen: Soft, gravid, appropriate for gestational age, non-tender to palpation but generalized pain persists. Pain/Pressure: Present     Pelvic:  Cervical exam performed Dilation: Closed Effacement (%): Thick Station: Ballotable  Extremities: Normal range of motion.     Mental Status: Normal mood and affect. Normal behavior. Normal  judgment and thought content.     Assessment   31 y.o. [id number] at 4715w0d, EDD 04/25/2017 by Ultrasound, presenting for prenatal problem visit.  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 No symptoms of preterm labor. Patient encouraged to go to triage if she develops more worrisome pattern of symptoms. Otherwise, advised rest and bland diet today, gradually increasing from clear liquids.  Preterm labor symptoms and general obstetric precautions including but not limited to vaginal bleeding, contractions, leaking of fluid and fetal movement were reviewed in detail with the patient.  Return if symptoms worsen or fail to improve. Keep ROB appointment on 11/30 with ultrasound.  Marcelyn BruinsJacelyn Zarai Orsborn, CNM 12/13/2016  1:44 PM

## 2016-12-16 ENCOUNTER — Ambulatory Visit (INDEPENDENT_AMBULATORY_CARE_PROVIDER_SITE_OTHER): Payer: Medicaid Other | Admitting: Advanced Practice Midwife

## 2016-12-16 ENCOUNTER — Encounter: Payer: Self-pay | Admitting: Advanced Practice Midwife

## 2016-12-16 ENCOUNTER — Ambulatory Visit (INDEPENDENT_AMBULATORY_CARE_PROVIDER_SITE_OTHER): Payer: Medicaid Other

## 2016-12-16 VITALS — BP 100/60 | Wt 160.0 lb

## 2016-12-16 DIAGNOSIS — O0990 Supervision of high risk pregnancy, unspecified, unspecified trimester: Secondary | ICD-10-CM

## 2016-12-16 DIAGNOSIS — Z3A21 21 weeks gestation of pregnancy: Secondary | ICD-10-CM

## 2016-12-16 DIAGNOSIS — O09212 Supervision of pregnancy with history of pre-term labor, second trimester: Secondary | ICD-10-CM | POA: Diagnosis not present

## 2016-12-16 DIAGNOSIS — Z362 Encounter for other antenatal screening follow-up: Secondary | ICD-10-CM

## 2016-12-16 NOTE — Progress Notes (Signed)
  Routine Prenatal Care Visit  Subjective  Lindsay Joseph is a 31 y.o. [id number] at 1048w3d being seen today for ongoing prenatal care.  She is currently monitored for the following issues for this high-risk pregnancy and has Supervision of high risk pregnancy, antepartum on their problem list.  ----------------------------------------------------------------------------------- Patient reports feeling braxton hicks and pressure in abdomen.   Denies vaginal bleeding. Denies leaking of fluid. Denies contractions. ----------------------------------------------------------------------------------- The following portions of the patient's history were reviewed and updated as appropriate: allergies, current medications, past family history, past medical history, past social history, past surgical history and problem list. Problem list updated.   Objective  Blood pressure 100/60, weight 160 lb (72.6 kg), last menstrual period 08/02/2016. Pregravid weight Pregravid weight not on file Total Weight Gain Not found. Urinalysis: Urine Protein: Trace Urine Glucose: Negative  Fetal Status: movement present TVUS today: cervix is 3.9 cm and does not change with fundal pressure  General:  Alert, oriented and cooperative. Patient is in no acute distress.  Skin: Skin is warm and dry. No rash noted.   Cardiovascular: Normal heart rate noted  Respiratory: Normal respiratory effort, no problems with respiration noted  Abdomen: Soft, gravid, appropriate for gestational age. Pain/Pressure: Present     Pelvic:  Cervical exam deferred        Extremities: Normal range of motion.     Mental Status: Normal mood and affect. Normal behavior. Normal judgment and thought content.   Assessment   31 y.o. [id number] at 6948w3d by  04/25/2017, by Ultrasound presenting for routine prenatal visit  Plan   pregnancy Problems (from 09/15/16 to present)    Problem Noted Resolved   Supervision of high risk pregnancy, antepartum  11/10/2016 by Oswaldo ConroySchmid, Jacelyn Y, CNM No   Overview Signed 12/13/2016  1:59 PM by Oswaldo ConroySchmid, Jacelyn Y, CNM    Clinic [facility] Prenatal Labs  Dating 09/26/2016 ultrasound (9w 6d) Blood type: A/Positive/-- (08/30 1411)   Genetic Screen 1 Screen:    AFP:     Quad:     NIPS: Antibody:Negative (08/30 1411)  Anatomic US  Rubella: 1.42 (08/30 1411) Varicella: Immune  GTT Early:               Third trimester:  RPR: Non Reactive (08/30 1411)   Rhogam  HBsAg: Negative (08/30 1411)   TDaP vaccine                       Flu Shot: HIV:     Baby Food                                GBS:   Contraception  Pap:  CBB     CS/VBAC    Support Person                  Preterm labor symptoms and general obstetric precautions including but not limited to vaginal bleeding, contractions, leaking of fluid and fetal movement were reviewed in detail with the patient.  Return in about 1 week (around 12/23/2016) for anatomy scan and rob.  Tresea MallJane Toshi Ishii, CNM  12/16/2016 3:10 PM

## 2016-12-16 NOTE — Progress Notes (Signed)
C/o a lot of cramps.rj

## 2016-12-23 ENCOUNTER — Ambulatory Visit (INDEPENDENT_AMBULATORY_CARE_PROVIDER_SITE_OTHER): Payer: Medicaid Other

## 2016-12-23 ENCOUNTER — Ambulatory Visit (INDEPENDENT_AMBULATORY_CARE_PROVIDER_SITE_OTHER): Payer: Medicaid Other | Admitting: Advanced Practice Midwife

## 2016-12-23 ENCOUNTER — Encounter: Payer: Self-pay | Admitting: Advanced Practice Midwife

## 2016-12-23 VITALS — BP 100/66 | Wt 162.0 lb

## 2016-12-23 DIAGNOSIS — Z362 Encounter for other antenatal screening follow-up: Secondary | ICD-10-CM

## 2016-12-23 DIAGNOSIS — O0990 Supervision of high risk pregnancy, unspecified, unspecified trimester: Secondary | ICD-10-CM

## 2016-12-23 DIAGNOSIS — IMO0002 Reserved for concepts with insufficient information to code with codable children: Secondary | ICD-10-CM

## 2016-12-23 DIAGNOSIS — Z3A22 22 weeks gestation of pregnancy: Secondary | ICD-10-CM

## 2016-12-23 DIAGNOSIS — Z0489 Encounter for examination and observation for other specified reasons: Secondary | ICD-10-CM

## 2016-12-23 NOTE — Progress Notes (Signed)
No complaints f/u anatomy today

## 2016-12-23 NOTE — Progress Notes (Signed)
  Routine Prenatal Care Visit  Subjective  Malachy Moodrica D Boffa is a 31 y.o. [id number] at 4752w3d being seen today for ongoing prenatal care.  She is currently monitored for the following issues for this high-risk pregnancy and has Supervision of high risk pregnancy, antepartum on their problem list.  ----------------------------------------------------------------------------------- Patient reports no complaints.   Denies contractions. Denies vaginal bleeding. Denies leaking of fluid.  ----------------------------------------------------------------------------------- The following portions of the patient's history were reviewed and updated as appropriate: allergies, current medications, past family history, past medical history, past social history, past surgical history and problem list. Problem list updated.   Objective  Blood pressure 100/66, weight 162 lb (73.5 kg), last menstrual period 08/02/2016. Pregravid weight Pregravid weight not on file Total Weight Gain Not found. Urinalysis:      Fetal Status: movement present Anatomy scan today incomplete for heart, N/L, profile, abdominal cord insertion  General:  Alert, oriented and cooperative. Patient is in no acute distress.  Skin: Skin is warm and dry. No rash noted.   Cardiovascular: Normal heart rate noted  Respiratory: Normal respiratory effort, no problems with respiration noted  Abdomen: Soft, gravid, appropriate for gestational age.       Pelvic:  Cervical exam deferred        Extremities: Normal range of motion.     Mental Status: Normal mood and affect. Normal behavior. Normal judgment and thought content.   Assessment   31 y.o. [id number] at 6352w3d by  04/25/2017, by Ultrasound presenting for routine prenatal visit  Plan   pregnancy Problems (from 09/15/16 to present)    Problem Noted Resolved   Supervision of high risk pregnancy, antepartum 11/10/2016 by Oswaldo ConroySchmid, Jacelyn Y, CNM No   Overview Signed 12/13/2016  1:59 PM by  Oswaldo ConroySchmid, Jacelyn Y, CNM    Clinic [facility] Prenatal Labs  Dating 09/26/2016 ultrasound (9w 6d) Blood type: A/Positive/-- (08/30 1411)   Genetic Screen 1 Screen:    AFP:     Quad:     NIPS: Antibody:Negative (08/30 1411)  Anatomic US  Rubella: 1.42 (08/30 1411) Varicella: Immune  GTT Early:               Third trimester:  RPR: Non Reactive (08/30 1411)   Rhogam  HBsAg: Negative (08/30 1411)   TDaP vaccine                       Flu Shot: HIV:     Baby Food                                GBS:   Contraception  Pap:  CBB     CS/VBAC    Support Person                  Preterm labor symptoms and general obstetric precautions including but not limited to vaginal bleeding, contractions, leaking of fluid and fetal movement were reviewed in detail with the patient.   Return in about 4 weeks (around 01/20/2017) for follow up anatomy, cervical length and rob.  Tresea MallJane Graceanna Theissen, CNM  12/23/2016 4:45 PM

## 2016-12-27 ENCOUNTER — Encounter: Payer: Self-pay | Admitting: Advanced Practice Midwife

## 2016-12-27 ENCOUNTER — Ambulatory Visit (INDEPENDENT_AMBULATORY_CARE_PROVIDER_SITE_OTHER): Payer: Medicaid Other | Admitting: Advanced Practice Midwife

## 2016-12-27 VITALS — BP 90/60 | Wt 163.0 lb

## 2016-12-27 DIAGNOSIS — O26899 Other specified pregnancy related conditions, unspecified trimester: Secondary | ICD-10-CM

## 2016-12-27 DIAGNOSIS — Z3A23 23 weeks gestation of pregnancy: Secondary | ICD-10-CM

## 2016-12-27 DIAGNOSIS — R109 Unspecified abdominal pain: Secondary | ICD-10-CM

## 2016-12-27 NOTE — Progress Notes (Signed)
C/o woke up to streaks of blood in discharge when she wiped this morning.  States it's gone now.  ? Mucus plug.  No intercourse since preg.  Cramping and didn't feel good at all yesterday, restless leg syndrome was crazy.  FM 4x in the last 8hrs.

## 2016-12-27 NOTE — Progress Notes (Signed)
Routine Prenatal Care Visit  Subjective  Lindsay Joseph is a [age] y.o. [id number] at 4646w0d being seen today for ongoing prenatal care.  She is currently monitored for the following issues for this high-risk pregnancy and has Supervision of high risk pregnancy, antepartum on their problem list.  ----------------------------------------------------------------------------------- Patient reports streak of blood when wiping this morning at 7 am. Deberah PeltonBraxton Hicks contractions and some cramping especially yesterday. Restless leg yesterday. Feeling of light headed. Has felt baby move 4 times since last night. She denies recent intercourse. She admits to drinking h2o but is unsure if it is an adequate amount. She admits to eating protein but may not be getting enough. She is taking a prenatal vitamin.  Contractions: Not present.  .  Movement: (!) Decreased. Denies leaking of fluid.  ----------------------------------------------------------------------------------- The following portions of the patient's history were reviewed and updated as appropriate: allergies, current medications, past family history, past medical history, past social history, past surgical history and problem list. Problem list updated.   Objective  Blood pressure 90/60, weight 163 lb (73.9 kg), last menstrual period 08/02/2016. Pregravid weight Pregravid weight not on file Total Weight Gain Not found. Urinalysis:      Fetal Status: Fetal Heart Rate (bpm): 145   Movement: (!) Decreased     General:  Alert, oriented and cooperative. Patient is in no acute distress.  Skin: Skin is warm and dry. No rash noted.   Cardiovascular: Normal heart rate noted  Respiratory: Normal respiratory effort, no problems with respiration noted  Abdomen: Soft, gravid, appropriate for gestational age. Pain/Pressure: Absent     Pelvic:  Cervical exam performed FT/thick/ballotable, no change from exam 4 days ago.   Extremities: Normal range of motion.       Mental Status: Normal mood and affect. Normal behavior. Normal judgment and thought content.   Assessment   31 y.o. E3P2951G8P0434 at 6946w0d by  04/25/2017, by Ultrasound presenting for work-in prenatal visit  Plan   pregnancy Problems (from 09/15/16 to present)    Problem Noted Resolved   Supervision of high risk pregnancy, antepartum 11/10/2016 by Oswaldo ConroySchmid, Jacelyn Y, CNM No   Overview Signed 12/13/2016  1:59 PM by Oswaldo ConroySchmid, Jacelyn Y, CNM    [facility] Prenatal Labs  Dating 09/26/2016 ultrasound (9w 6d) Blood type: A/Positive/-- (08/30 1411)   Genetic Screen 1 Screen:    AFP:     Quad:     NIPS: Antibody:Negative (08/30 1411)  Anatomic US  Rubella: 1.42 (08/30 1411) Varicella: Immune  GTT Early:               Third trimester:  RPR: Non Reactive (08/30 1411)   Rhogam  HBsAg: Negative (08/30 1411)   TDaP vaccine                       Flu Shot: HIV:     Baby Food                                GBS:   Contraception  Pap:  CBB     CS/VBAC    Support Person                  Preterm labor symptoms and general obstetric precautions including but not limited to vaginal bleeding, contractions, leaking of fluid and fetal movement were reviewed in detail with the patient. Patient is encouraged to increase hydration, increase protein intake,  take a magnesium supplement, soak in tub with epsom salts.    Return for has f/u appt already scheduled.  Lindsay Joseph, CNM  12/27/2016 11:12 AM

## 2017-01-08 ENCOUNTER — Emergency Department (HOSPITAL_COMMUNITY): Payer: Medicaid Other

## 2017-01-08 ENCOUNTER — Encounter (HOSPITAL_COMMUNITY): Payer: Self-pay

## 2017-01-08 ENCOUNTER — Inpatient Hospital Stay (HOSPITAL_COMMUNITY)
Admission: EM | Admit: 2017-01-08 | Discharge: 2017-01-09 | Disposition: A | Discharge status: Home / Self Care | Payer: Medicaid Other | Attending: Obstetrics & Gynecology | Admitting: Obstetrics & Gynecology

## 2017-01-08 DIAGNOSIS — Z3A22 22 weeks gestation of pregnancy: Secondary | ICD-10-CM | POA: Insufficient documentation

## 2017-01-08 DIAGNOSIS — O9A212 Injury, poisoning and certain other consequences of external causes complicating pregnancy, second trimester: Secondary | ICD-10-CM | POA: Diagnosis not present

## 2017-01-08 DIAGNOSIS — O0990 Supervision of high risk pregnancy, unspecified, unspecified trimester: Secondary | ICD-10-CM

## 2017-01-08 LAB — CBC
HEMATOCRIT: 33.3 % — AB (ref 36.0–46.0)
HEMOGLOBIN: 11.2 g/dL — AB (ref 12.0–15.0)
MCH: 29 pg (ref 26.0–34.0)
MCHC: 33.6 g/dL (ref 30.0–36.0)
MCV: 86.3 fL (ref 78.0–100.0)
Platelets: 192 10*3/uL (ref 150–400)
RBC: 3.86 MIL/uL — ABNORMAL LOW (ref 3.87–5.11)
RDW: 12.4 % (ref 11.5–15.5)
WBC: 9 10*3/uL (ref 4.0–10.5)

## 2017-01-08 LAB — I-STAT CHEM 8, ED
BUN: 6 mg/dL (ref 6–20)
CALCIUM ION: 1.07 mmol/L — AB (ref 1.15–1.40)
CREATININE: 0.5 mg/dL (ref 0.44–1.00)
Chloride: 105 mmol/L (ref 101–111)
GLUCOSE: 82 mg/dL (ref 65–99)
HCT: 32 % — ABNORMAL LOW (ref 36.0–46.0)
Hemoglobin: 10.9 g/dL — ABNORMAL LOW (ref 12.0–15.0)
Potassium: 3.4 mmol/L — ABNORMAL LOW (ref 3.5–5.1)
SODIUM: 138 mmol/L (ref 135–145)
TCO2: 20 mmol/L — AB (ref 22–32)

## 2017-01-08 LAB — COMPREHENSIVE METABOLIC PANEL
ALT: 10 U/L — ABNORMAL LOW (ref 14–54)
ANION GAP: 9 (ref 5–15)
AST: 15 U/L (ref 15–41)
Albumin: 2.9 g/dL — ABNORMAL LOW (ref 3.5–5.0)
Alkaline Phosphatase: 48 U/L (ref 38–126)
BILIRUBIN TOTAL: 0.6 mg/dL (ref 0.3–1.2)
BUN: 5 mg/dL — ABNORMAL LOW (ref 6–20)
CALCIUM: 8.4 mg/dL — AB (ref 8.9–10.3)
CO2: 18 mmol/L — ABNORMAL LOW (ref 22–32)
Chloride: 106 mmol/L (ref 101–111)
Creatinine, Ser: 0.56 mg/dL (ref 0.44–1.00)
Glucose, Bld: 80 mg/dL (ref 65–99)
POTASSIUM: 3.3 mmol/L — AB (ref 3.5–5.1)
Sodium: 133 mmol/L — ABNORMAL LOW (ref 135–145)
TOTAL PROTEIN: 5.8 g/dL — AB (ref 6.5–8.1)

## 2017-01-08 LAB — SAMPLE TO BLOOD BANK

## 2017-01-08 LAB — PROTIME-INR
INR: 1
PROTHROMBIN TIME: 13.1 s (ref 11.4–15.2)

## 2017-01-08 LAB — I-STAT CG4 LACTIC ACID, ED: LACTIC ACID, VENOUS: 0.9 mmol/L (ref 0.5–1.9)

## 2017-01-08 LAB — CDS SEROLOGY

## 2017-01-08 LAB — ETHANOL

## 2017-01-08 MED ORDER — SODIUM CHLORIDE 0.9 % IV SOLN
INTRAVENOUS | Status: AC | PRN
  Administered 2017-01-08: 1000 mL via INTRAVENOUS
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
## 2017-01-08 NOTE — MAU Note (Signed)
[facility].  Denies LOF or bleeding. +FM.
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
## 2017-01-08 NOTE — ED Notes (Signed)
Called [facility] to have pt transported to [facility]U
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
## 2017-01-09 ENCOUNTER — Inpatient Hospital Stay (HOSPITAL_COMMUNITY): Payer: Medicaid Other

## 2017-01-09 DIAGNOSIS — O9A212 Injury, poisoning and certain other consequences of external causes complicating pregnancy, second trimester: Secondary | ICD-10-CM | POA: Diagnosis present

## 2017-01-09 MED ORDER — CYCLOBENZAPRINE HCL 10 MG PO TABS
10.0000 mg | ORAL_TABLET | Freq: Once | ORAL | Status: AC
  Administered 2017-01-09: 10 mg via ORAL
  Filled 2017-01-09: qty 1

## 2017-01-09 MED ORDER — CYCLOBENZAPRINE HCL 5 MG PO TABS: 5.0000 mg | ORAL_TABLET | Freq: Three times a day (TID) | ORAL | 0 refills | Status: DC | PRN
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
## 2017-01-20 ENCOUNTER — Other Ambulatory Visit: Payer: Self-pay | Admitting: Obstetrics and Gynecology

## 2017-01-20 ENCOUNTER — Encounter: Payer: Self-pay | Admitting: Obstetrics and Gynecology

## 2017-01-20 ENCOUNTER — Ambulatory Visit (INDEPENDENT_AMBULATORY_CARE_PROVIDER_SITE_OTHER): Payer: Medicaid Other

## 2017-01-20 ENCOUNTER — Ambulatory Visit (INDEPENDENT_AMBULATORY_CARE_PROVIDER_SITE_OTHER): Payer: Medicaid Other | Admitting: Obstetrics and Gynecology

## 2017-01-20 VITALS — BP 102/62 | Wt 158.0 lb

## 2017-01-20 DIAGNOSIS — O09212 Supervision of pregnancy with history of pre-term labor, second trimester: Secondary | ICD-10-CM

## 2017-01-20 DIAGNOSIS — Z362 Encounter for other antenatal screening follow-up: Secondary | ICD-10-CM | POA: Diagnosis not present

## 2017-01-20 DIAGNOSIS — O0993 Supervision of high risk pregnancy, unspecified, third trimester: Secondary | ICD-10-CM

## 2017-01-20 DIAGNOSIS — O0990 Supervision of high risk pregnancy, unspecified, unspecified trimester: Secondary | ICD-10-CM | POA: Diagnosis not present

## 2017-01-20 DIAGNOSIS — O09219 Supervision of pregnancy with history of pre-term labor, unspecified trimester: Secondary | ICD-10-CM

## 2017-01-20 DIAGNOSIS — Z3A26 26 weeks gestation of pregnancy: Secondary | ICD-10-CM

## 2017-01-20 DIAGNOSIS — IMO0002 Reserved for concepts with insufficient information to code with codable children: Secondary | ICD-10-CM

## 2017-01-20 DIAGNOSIS — O09892 Supervision of other high risk pregnancies, second trimester: Secondary | ICD-10-CM

## 2017-01-20 DIAGNOSIS — Z0489 Encounter for examination and observation for other specified reasons: Secondary | ICD-10-CM | POA: Diagnosis not present

## 2017-01-20 DIAGNOSIS — O09899 Supervision of other high risk pregnancies, unspecified trimester: Secondary | ICD-10-CM | POA: Insufficient documentation
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
## 2017-02-03 ENCOUNTER — Other Ambulatory Visit: Payer: Medicaid Other

## 2017-02-03 ENCOUNTER — Encounter: Payer: Medicaid Other | Admitting: Obstetrics & Gynecology

## 2017-02-27 ENCOUNTER — Other Ambulatory Visit: Payer: Self-pay | Admitting: Certified Nurse Midwife

## 2017-02-27 ENCOUNTER — Other Ambulatory Visit: Payer: Self-pay

## 2017-02-27 ENCOUNTER — Observation Stay
Admission: EM | Admit: 2017-02-27 | Discharge: 2017-02-27 | Disposition: A | Discharge status: Home / Self Care | Payer: Medicaid Other | Attending: Certified Nurse Midwife | Admitting: Certified Nurse Midwife

## 2017-02-27 DIAGNOSIS — O09213 Supervision of pregnancy with history of pre-term labor, third trimester: Secondary | ICD-10-CM

## 2017-02-27 DIAGNOSIS — O9A212 Injury, poisoning and certain other consequences of external causes complicating pregnancy, second trimester: Secondary | ICD-10-CM

## 2017-02-27 DIAGNOSIS — Z3A31 31 weeks gestation of pregnancy: Secondary | ICD-10-CM

## 2017-02-27 DIAGNOSIS — O0990 Supervision of high risk pregnancy, unspecified, unspecified trimester: Secondary | ICD-10-CM

## 2017-02-27 DIAGNOSIS — O09893 Supervision of other high risk pregnancies, third trimester: Secondary | ICD-10-CM

## 2017-02-27 DIAGNOSIS — O26893 Other specified pregnancy related conditions, third trimester: Secondary | ICD-10-CM | POA: Diagnosis not present

## 2017-02-27 DIAGNOSIS — O4703 False labor before 37 completed weeks of gestation, third trimester: Secondary | ICD-10-CM

## 2017-02-27 DIAGNOSIS — O4700 False labor before 37 completed weeks of gestation, unspecified trimester: Secondary | ICD-10-CM | POA: Diagnosis present

## 2017-02-27 HISTORY — DX: Restless legs syndrome: G25.81

## 2017-02-27 HISTORY — DX: Personal history of physical and sexual abuse in childhood: Z62.810

## 2017-02-27 LAB — URINALYSIS, COMPLETE (UACMP) WITH MICROSCOPIC
BACTERIA UA: NONE SEEN
BILIRUBIN URINE: NEGATIVE
Glucose, UA: NEGATIVE mg/dL
KETONES UR: NEGATIVE mg/dL
NITRITE: NEGATIVE
Protein, ur: NEGATIVE mg/dL
SPECIFIC GRAVITY, URINE: 1.023 (ref 1.005–1.030)
pH: 5 (ref 5.0–8.0)

## 2017-02-27 LAB — CHLAMYDIA/NGC RT PCR (ARMC ONLY)
CHLAMYDIA TR: NOT DETECTED
N gonorrhoeae: NOT DETECTED

## 2017-02-27 MED ORDER — LACTATED RINGERS IV SOLN
500.0000 mL | Freq: Once | INTRAVENOUS | Status: AC
  Administered 2017-02-27: 500 mL via INTRAVENOUS

## 2017-02-27 MED ORDER — TERBUTALINE SULFATE 1 MG/ML IJ SOLN
0.2500 mg | Freq: Once | INTRAMUSCULAR | Status: AC
  Administered 2017-02-27: 0.25 mg via SUBCUTANEOUS

## 2017-02-27 MED ORDER — BETAMETHASONE SOD PHOS & ACET 6 (3-3) MG/ML IJ SUSP
12.0000 mg | INTRAMUSCULAR | Status: DC
  Administered 2017-02-27: 12 mg via INTRAMUSCULAR

## 2017-02-27 MED ORDER — BETAMETHASONE SOD PHOS & ACET 6 (3-3) MG/ML IJ SUSP
INTRAMUSCULAR | Status: AC
  Filled 2017-02-27: qty 1

## 2017-02-27 MED ORDER — TERBUTALINE SULFATE 1 MG/ML IJ SOLN
INTRAMUSCULAR | Status: AC
  Filled 2017-02-27: qty 1

## 2017-02-27 MED ORDER — NIFEDIPINE 10 MG PO CAPS: 10.0000 mg | ORAL_CAPSULE | Freq: Four times a day (QID) | ORAL | 1 refills | Status: DC

## 2017-02-27 MED ORDER — LACTATED RINGERS IV SOLN: INTRAVENOUS | Status: DC

## 2017-02-27 MED ORDER — NIFEDIPINE 10 MG PO CAPS: 10.0000 mg | ORAL_CAPSULE | Freq: Four times a day (QID) | ORAL | Status: DC

## 2017-02-27 MED ORDER — NIFEDIPINE 10 MG PO CAPS
ORAL_CAPSULE | ORAL | Status: AC
Start: 2017-02-27 — End: 2017-02-27
  Administered 2017-02-27: 10 mg
  Filled 2017-02-27: qty 1

## 2017-02-27 NOTE — Discharge Summary (Signed)
See final progress note 02/27/2017  Lindsay Joseph, CNM

## 2017-02-27 NOTE — Final Progress Note (Signed)
Physician Final Progress Note  Patient ID: Lindsay Joseph MRN: [id number] DOB/AGE: 33/02/1985 34 y.o.  Admit date: 02/27/2017 Admitting provider: Vena AustriaAndreas Staebler, MD/ Trinna Balloonolleen l. Desi Rowe, CNM Discharge date: 02/27/2017   Admission Diagnoses: Threatened preterm labor at 31wk6days History of preterm labor and delivery x 4  Discharge Diagnoses: same as above  Consults: None  Significant Findings/ Diagnostic Studies: Lindsay Moodrica D Guterrez is a 32 y.o. G4W1027G8P0434 [facility] 04[facility]25/2017 at 4445w6d dated by a 9wk6d ultrasound.  Her pregnancy has been complicated by five preterm deliveries (one was a second trimester loss at 20 weeks).   She opted not to have 17 P injections. She presents to L&D for evaluation of lower abdominal pressure and cramping that started at 0600 this Am and had a bloody show at 0700.  She was contracting every 2-3 minutes on arrival and her cervix was 1.5/50-60%/-1. An ultrasound confirmed the baby was in cephalic presentation (ROA). Wet prep was negative. UA was negative except being concentrated with the sp gravity of 1.023. GC/Chlamydia cultures pending. FHR was reactive. Had a difficulty with keeping baby on the monitor because she has restless leg syndrome and had difficulty being in one position for very long. She was given an IV bolus and  terbutaline 0.25 mgm  subcutaneously x2 and the contractions spaced out. Recheck of her cervix one hour after arrival was unchanged except baby was at -2 station and the cervix was less effaced. She was also given betamethasone 12 mgm IM and begun on Procardia 10 mgm po q6 hours. Four hours after arrival the cervix was unchanged (1.5/25-50%/-2 and ballotable). She requested to be discharged so she could go home and soak in a hot tub for her restless legs. She was essentially acontractile and she was discharged.    Procedures: none  Discharge Condition: stable  Disposition: 01-Home or Self Care  Diet: Regular diet  Discharge Activity:  Pelvic rest/ Modified bedrest this next week. No prolonged standing or walking.   Discharge Instructions    Discontinue IV   Complete by:  As directed      Allergies as of 02/27/2017   No Known Allergies     Medication List    TAKE these medications   cyclobenzaprine 5 MG tablet Commonly known as:  FLEXERIL Take 1 tablet (5 mg total) by mouth 3 (three) times daily as needed for muscle spasms.   NIFEdipine 10 MG capsule Commonly known as:  PROCARDIA Take 1 capsule (10 mg total) by mouth every 6 (six) hours.   prenatal multivitamin Tabs tablet Take 1 tablet by mouth daily at 12 noon.      Follow up: RTN to L&D in AM for second betamethasone injection.  ROB/ 28 week labs/ and [facility].  Total time spent taking care of this patient: 30 minutes  Signed: Farrel Connersolleen Markella Dao , CNM 02/27/2017, 1:05 PM
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
## 2017-02-27 NOTE — OB Triage Note (Signed)
Pt G8P4 complains of vaginal bleeding since 0700 this morning. Pt states "bright red bleeding at 0700, but changed her underwear. Since has only wiped and seen dark brown blood on toilet paper." Pt states abdominal pain since 0600 this morning. Said they were an 8/10 in pain, but has decreased in intensity and now rates it a 0/10. Pt states no sex since she conceived and has been keeping herself hydrated. VSS. Monitors applied and assessing.

## 2017-02-28 ENCOUNTER — Observation Stay
Admission: RE | Admit: 2017-02-28 | Discharge: 2017-02-28 | Disposition: A | Discharge status: Home / Self Care | Payer: Medicaid Other | Attending: Obstetrics and Gynecology | Admitting: Obstetrics and Gynecology

## 2017-02-28 DIAGNOSIS — G2581 Restless legs syndrome: Secondary | ICD-10-CM | POA: Insufficient documentation

## 2017-02-28 DIAGNOSIS — Z3A32 32 weeks gestation of pregnancy: Secondary | ICD-10-CM | POA: Diagnosis not present

## 2017-02-28 DIAGNOSIS — Z3A31 31 weeks gestation of pregnancy: Secondary | ICD-10-CM | POA: Diagnosis not present

## 2017-02-28 DIAGNOSIS — O26893 Other specified pregnancy related conditions, third trimester: Secondary | ICD-10-CM | POA: Diagnosis not present

## 2017-02-28 DIAGNOSIS — Z79899 Other long term (current) drug therapy: Secondary | ICD-10-CM | POA: Insufficient documentation

## 2017-02-28 LAB — CULTURE, BETA STREP (GROUP B ONLY)

## 2017-02-28 MED ORDER — BETAMETHASONE SOD PHOS & ACET 6 (3-3) MG/ML IJ SUSP
12.0000 mg | Freq: Once | INTRAMUSCULAR | Status: AC
  Administered 2017-02-28: 12 mg via INTRAMUSCULAR

## 2017-02-28 MED ORDER — BETAMETHASONE SOD PHOS & ACET 6 (3-3) MG/ML IJ SUSP
INTRAMUSCULAR | Status: AC
  Filled 2017-02-28: qty 1

## 2017-02-28 MED ORDER — BETAMETHASONE SOD PHOS & ACET 6 (3-3) MG/ML IJ SUSP
INTRAMUSCULAR | Status: AC
  Administered 2017-02-28: 12 mg via INTRAMUSCULAR
  Filled 2017-02-28: qty 1
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
## 2017-02-28 NOTE — Discharge Summary (Signed)
Pt came to receive her 2nd dose of betamethasone. Shot given. No questions from patient. Reviewed discharge instructions with patient. Pt discharged home.

## 2017-03-01 ENCOUNTER — Ambulatory Visit (INDEPENDENT_AMBULATORY_CARE_PROVIDER_SITE_OTHER): Payer: Medicaid Other

## 2017-03-01 ENCOUNTER — Other Ambulatory Visit: Payer: Medicaid Other

## 2017-03-01 ENCOUNTER — Ambulatory Visit (INDEPENDENT_AMBULATORY_CARE_PROVIDER_SITE_OTHER): Payer: Medicaid Other | Admitting: Certified Nurse Midwife

## 2017-03-01 VITALS — BP 120/60 | Wt 160.0 lb

## 2017-03-01 DIAGNOSIS — O368130 Decreased fetal movements, third trimester, not applicable or unspecified: Secondary | ICD-10-CM

## 2017-03-01 DIAGNOSIS — O0990 Supervision of high risk pregnancy, unspecified, unspecified trimester: Secondary | ICD-10-CM | POA: Diagnosis not present

## 2017-03-01 DIAGNOSIS — Z23 Encounter for immunization: Secondary | ICD-10-CM

## 2017-03-01 DIAGNOSIS — Z3A32 32 weeks gestation of pregnancy: Secondary | ICD-10-CM | POA: Diagnosis not present

## 2017-03-01 DIAGNOSIS — O09899 Supervision of other high risk pregnancies, unspecified trimester: Secondary | ICD-10-CM

## 2017-03-01 DIAGNOSIS — O288 Other abnormal findings on antenatal screening of mother: Secondary | ICD-10-CM

## 2017-03-01 DIAGNOSIS — O09219 Supervision of pregnancy with history of pre-term labor, unspecified trimester: Secondary | ICD-10-CM

## 2017-03-01 DIAGNOSIS — G2581 Restless legs syndrome: Secondary | ICD-10-CM | POA: Insufficient documentation

## 2017-03-01 DIAGNOSIS — O09213 Supervision of pregnancy with history of pre-term labor, third trimester: Secondary | ICD-10-CM

## 2017-03-01 DIAGNOSIS — O09893 Supervision of other high risk pregnancies, third trimester: Secondary | ICD-10-CM

## 2017-03-01 DIAGNOSIS — O0993 Supervision of high risk pregnancy, unspecified, third trimester: Secondary | ICD-10-CM

## 2017-03-01 LAB — FETAL NONSTRESS TEST

## 2017-03-01 MED ORDER — ZOLPIDEM TARTRATE 5 MG PO TABS: 5.0000 mg | ORAL_TABLET | Freq: Every evening | ORAL | 0 refills | Status: DC | PRN

## 2017-03-01 NOTE — Progress Notes (Signed)
Pt reports no problems. Growth and 28 wk labs today. Tdap and blood consent today.

## 2017-03-01 NOTE — Progress Notes (Signed)
HROB (preterm labor and delivery x 5, not on Makena) at 32wk1d/ 28 week labs/ growth scan/ Follow up from hospital visit 2/11 for threatened preterm labor. Cervix was dilated to 1.5 cm. And had bloody show. Treated with terbutaline 0.25 mg x2 [place] and patient started on Procardia 10 mgm q6h. Felt some tightening this AM, but no pain.  Had betamethasone 2/11 and 2/12. Has not been feeling as much fetal movement over the last 2 days. Reactive strip on 2/11 Growth scan today: EFW 5#5oz (78th%) with normal AFI 15.21 cm Missed appointment for 28 week labs: doing today-1 hr GTT may be elevated due to betamethasone. Having problems with restless leg syndrome-has with all her pregnancies-only relief has been sitting in a warm tub bath. Has tried magnesium supplements without relief. Will try giving Ambien 5 mgm for sleep.  Non stress test today for decreased FM is reactive with baseline 140 and accelerations to 160s to 170, moderate variability. Wants to breast feed/[facility] ROB in 1 week. [facility] instructions given Note to start maternity disability  Farrel Connersolleen Donia [place], [place]

## 2017-03-02 LAB — 28 WEEK RH+PANEL
BASOS ABS: 0 10*3/uL (ref 0.0–0.2)
Basos: 0 %
EOS (ABSOLUTE): 0 10*3/uL (ref 0.0–0.4)
Eos: 0 %
Gestational Diabetes Screen: 126 mg/dL (ref 65–139)
HEMATOCRIT: 30.6 % — AB (ref 34.0–46.6)
HIV Screen 4th Generation wRfx: NONREACTIVE
Hemoglobin: 10.2 g/dL — ABNORMAL LOW (ref 11.1–15.9)
IMMATURE GRANULOCYTES: 0 %
Immature Grans (Abs): 0 10*3/uL (ref 0.0–0.1)
LYMPHS ABS: 1.2 10*3/uL (ref 0.7–3.1)
Lymphs: 12 %
MCH: 27.5 pg (ref 26.6–33.0)
MCHC: 33.3 g/dL (ref 31.5–35.7)
MCV: 83 fL (ref 79–97)
Monocytes Absolute: 0.8 10*3/uL (ref 0.1–0.9)
Monocytes: 8 %
NEUTROS PCT: 80 %
Neutrophils Absolute: 8 10*3/uL — ABNORMAL HIGH (ref 1.4–7.0)
PLATELETS: 195 10*3/uL (ref 150–379)
RBC: 3.71 x10E6/uL — ABNORMAL LOW (ref 3.77–5.28)
RDW: 12.9 % (ref 12.3–15.4)
RPR: NONREACTIVE
WBC: 10.1 10*3/uL (ref 3.4–10.8)

## 2017-03-06 ENCOUNTER — Other Ambulatory Visit: Payer: Self-pay | Admitting: Obstetrics and Gynecology

## 2017-03-06 DIAGNOSIS — D508 Other iron deficiency anemias: Secondary | ICD-10-CM

## 2017-03-06 MED ORDER — FERROUS SULFATE 325 (65 FE) MG PO TABS: 325.0000 mg | ORAL_TABLET | Freq: Two times a day (BID) | ORAL | 11 refills | Status: DC

## 2017-03-06 NOTE — Progress Notes (Signed)
Called and discussed results with patient. Asked her to start taking iron supplements 2-3 times a day.

## 2017-03-08 ENCOUNTER — Encounter: Payer: Self-pay | Admitting: Obstetrics and Gynecology

## 2017-03-08 ENCOUNTER — Ambulatory Visit (INDEPENDENT_AMBULATORY_CARE_PROVIDER_SITE_OTHER): Payer: Medicaid Other | Admitting: Obstetrics and Gynecology

## 2017-03-08 VITALS — BP 118/74 | Wt 157.0 lb

## 2017-03-08 DIAGNOSIS — O9A212 Injury, poisoning and certain other consequences of external causes complicating pregnancy, second trimester: Secondary | ICD-10-CM

## 2017-03-08 DIAGNOSIS — Z3A33 33 weeks gestation of pregnancy: Secondary | ICD-10-CM

## 2017-03-08 DIAGNOSIS — O9A213 Injury, poisoning and certain other consequences of external causes complicating pregnancy, third trimester: Secondary | ICD-10-CM

## 2017-03-08 DIAGNOSIS — O09219 Supervision of pregnancy with history of pre-term labor, unspecified trimester: Secondary | ICD-10-CM

## 2017-03-08 DIAGNOSIS — O0993 Supervision of high risk pregnancy, unspecified, third trimester: Secondary | ICD-10-CM | POA: Diagnosis not present

## 2017-03-08 DIAGNOSIS — O09899 Supervision of other high risk pregnancies, unspecified trimester: Secondary | ICD-10-CM

## 2017-03-08 DIAGNOSIS — O0990 Supervision of high risk pregnancy, unspecified, unspecified trimester: Secondary | ICD-10-CM

## 2017-03-08 NOTE — Progress Notes (Signed)
No vb. No lof.  
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
## 2017-03-15 ENCOUNTER — Encounter: Payer: Medicaid Other | Admitting: Advanced Practice Midwife

## 2017-03-20 ENCOUNTER — Encounter: Payer: Self-pay | Admitting: Obstetrics and Gynecology

## 2017-03-20 ENCOUNTER — Ambulatory Visit (INDEPENDENT_AMBULATORY_CARE_PROVIDER_SITE_OTHER): Payer: Medicaid Other | Admitting: Obstetrics and Gynecology

## 2017-03-20 DIAGNOSIS — K5900 Constipation, unspecified: Secondary | ICD-10-CM | POA: Diagnosis not present

## 2017-03-20 MED ORDER — DOCUSATE SODIUM 100 MG PO CAPS: 100.0000 mg | ORAL_CAPSULE | Freq: Two times a day (BID) | ORAL | 2 refills | Status: DC | PRN
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
## 2017-04-03 ENCOUNTER — Encounter: Payer: Self-pay | Admitting: Obstetrics and Gynecology

## 2017-04-03 ENCOUNTER — Ambulatory Visit (INDEPENDENT_AMBULATORY_CARE_PROVIDER_SITE_OTHER): Payer: Medicaid Other | Admitting: Obstetrics and Gynecology

## 2017-04-03 VITALS — BP 124/78 | Temp 98.3°F | Ht 64.0 in

## 2017-04-03 DIAGNOSIS — O9122 Nonpurulent mastitis associated with the puerperium: Secondary | ICD-10-CM

## 2017-04-03 MED ORDER — DICLOXACILLIN SODIUM 500 MG PO CAPS: 500.0000 mg | ORAL_CAPSULE | Freq: Four times a day (QID) | ORAL | 0 refills | Status: DC
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
## 2017-04-07 ENCOUNTER — Ambulatory Visit (INDEPENDENT_AMBULATORY_CARE_PROVIDER_SITE_OTHER): Payer: Medicaid Other | Admitting: Obstetrics and Gynecology

## 2017-04-07 ENCOUNTER — Encounter: Payer: Self-pay | Admitting: Obstetrics and Gynecology

## 2017-04-07 VITALS — BP 118/74 | Ht 64.0 in

## 2017-04-07 DIAGNOSIS — N61 Mastitis without abscess: Secondary | ICD-10-CM | POA: Diagnosis not present

## 2017-04-07 MED ORDER — HYDROCODONE-ACETAMINOPHEN 5-325 MG PO TABS: 1.0000 | ORAL_TABLET | Freq: Four times a day (QID) | ORAL | 0 refills | Status: DC | PRN

## 2017-04-07 MED ORDER — SULFAMETHOXAZOLE-TRIMETHOPRIM 800-160 MG PO TABS: 1.0000 | ORAL_TABLET | Freq: Two times a day (BID) | ORAL | 0 refills | Status: AC
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
## 2017-04-24 ENCOUNTER — Ambulatory Visit: Payer: Medicaid Other | Admitting: Obstetrics and Gynecology

## 2017-06-21 DIAGNOSIS — E663 Overweight: Secondary | ICD-10-CM | POA: Insufficient documentation

## 2017-07-18 ENCOUNTER — Encounter (HOSPITAL_COMMUNITY): Payer: Self-pay

## 2018-06-10 ENCOUNTER — Other Ambulatory Visit: Payer: Self-pay

## 2018-06-10 ENCOUNTER — Emergency Department: Payer: Medicaid Other

## 2018-06-10 ENCOUNTER — Emergency Department
Admission: EM | Admit: 2018-06-10 | Discharge: 2018-06-10 | Disposition: A | Discharge status: Home / Self Care | Payer: Medicaid Other | Attending: Emergency Medicine | Admitting: Emergency Medicine

## 2018-06-10 DIAGNOSIS — R519 Headache, unspecified: Secondary | ICD-10-CM

## 2018-06-10 DIAGNOSIS — R51 Headache: Secondary | ICD-10-CM | POA: Diagnosis not present

## 2018-06-10 NOTE — ED Triage Notes (Signed)
Reports having headache for approximately 2 hours.

## 2018-06-10 NOTE — ED Notes (Signed)
Report from Rachel, RN.

## 2018-06-10 NOTE — ED Notes (Signed)
PT reports sudden onset intense headache with eye throbbing and getting hot. Headache went away and came back. PT states only minor pain at this time.
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
## 2018-06-10 NOTE — Discharge Instructions (Addendum)
Make an appointment to follow-up with your doctor within the next several weeks.  Return to the ER for any new, worsening, or persistent headache especially if it does not go away after a few minutes, or any other new or worsening symptoms that concern you.

## 2018-07-02 ENCOUNTER — Ambulatory Visit: Payer: Medicaid Other | Admitting: Obstetrics and Gynecology

## 2018-09-07 DIAGNOSIS — Z8659 Personal history of other mental and behavioral disorders: Secondary | ICD-10-CM

## 2018-09-07 DIAGNOSIS — E663 Overweight: Secondary | ICD-10-CM

## 2018-09-10 ENCOUNTER — Ambulatory Visit: Payer: Self-pay

## 2018-09-11 DIAGNOSIS — R413 Other amnesia: Secondary | ICD-10-CM | POA: Insufficient documentation

## 2018-09-11 DIAGNOSIS — R519 Headache, unspecified: Secondary | ICD-10-CM | POA: Insufficient documentation

## 2018-09-25 ENCOUNTER — Ambulatory Visit: Payer: Medicaid Other

## 2018-11-27 ENCOUNTER — Ambulatory Visit: Payer: Medicaid Other | Admitting: Obstetrics and Gynecology

## 2019-03-12 DIAGNOSIS — G479 Sleep disorder, unspecified: Secondary | ICD-10-CM | POA: Insufficient documentation

## 2019-09-19 ENCOUNTER — Encounter: Payer: Medicaid Other | Admitting: Obstetrics and Gynecology

## 2019-10-15 ENCOUNTER — Encounter: Payer: Medicaid Other | Admitting: Obstetrics & Gynecology

## 2019-11-06 ENCOUNTER — Other Ambulatory Visit (HOSPITAL_COMMUNITY)
Admission: RE | Admit: 2019-11-06 | Discharge: 2019-11-06 | Disposition: A | Discharge status: Home / Self Care | Payer: Medicaid Other | Source: Ambulatory Visit | Attending: Obstetrics and Gynecology | Admitting: Obstetrics and Gynecology

## 2019-11-06 ENCOUNTER — Other Ambulatory Visit: Payer: Self-pay

## 2019-11-06 ENCOUNTER — Encounter: Payer: Self-pay | Admitting: Obstetrics and Gynecology

## 2019-11-06 ENCOUNTER — Ambulatory Visit (INDEPENDENT_AMBULATORY_CARE_PROVIDER_SITE_OTHER): Payer: Medicaid Other | Admitting: Obstetrics and Gynecology

## 2019-11-06 VITALS — BP 114/72 | HR 92 | Wt 166.0 lb

## 2019-11-06 DIAGNOSIS — Z3689 Encounter for other specified antenatal screening: Secondary | ICD-10-CM

## 2019-11-06 DIAGNOSIS — F32A Depression, unspecified: Secondary | ICD-10-CM

## 2019-11-06 DIAGNOSIS — Z113 Encounter for screening for infections with a predominantly sexual mode of transmission: Secondary | ICD-10-CM | POA: Diagnosis present

## 2019-11-06 DIAGNOSIS — O0930 Supervision of pregnancy with insufficient antenatal care, unspecified trimester: Secondary | ICD-10-CM

## 2019-11-06 DIAGNOSIS — N761 Subacute and chronic vaginitis: Secondary | ICD-10-CM

## 2019-11-06 DIAGNOSIS — F419 Anxiety disorder, unspecified: Secondary | ICD-10-CM

## 2019-11-06 DIAGNOSIS — Z3201 Encounter for pregnancy test, result positive: Secondary | ICD-10-CM

## 2019-11-06 DIAGNOSIS — O0990 Supervision of high risk pregnancy, unspecified, unspecified trimester: Secondary | ICD-10-CM

## 2019-11-06 DIAGNOSIS — Z124 Encounter for screening for malignant neoplasm of cervix: Secondary | ICD-10-CM

## 2019-11-06 DIAGNOSIS — Z1239 Encounter for other screening for malignant neoplasm of breast: Secondary | ICD-10-CM

## 2019-11-06 DIAGNOSIS — O09899 Supervision of other high risk pregnancies, unspecified trimester: Secondary | ICD-10-CM

## 2019-11-06 DIAGNOSIS — O09529 Supervision of elderly multigravida, unspecified trimester: Secondary | ICD-10-CM

## 2019-11-06 DIAGNOSIS — Z3A15 15 weeks gestation of pregnancy: Secondary | ICD-10-CM

## 2019-11-06 LAB — POCT URINALYSIS DIPSTICK OB
Glucose, UA: NEGATIVE
POC,PROTEIN,UA: NEGATIVE

## 2019-11-06 LAB — POCT URINE PREGNANCY: Preg Test, Ur: POSITIVE — AB

## 2019-11-06 MED ORDER — ESCITALOPRAM OXALATE 10 MG PO TABS: 10.0000 mg | ORAL_TABLET | Freq: Every day | ORAL | 3 refills | Status: DC
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
## 2019-11-07 DIAGNOSIS — O0990 Supervision of high risk pregnancy, unspecified, unspecified trimester: Secondary | ICD-10-CM | POA: Insufficient documentation

## 2019-11-07 DIAGNOSIS — F32A Depression, unspecified: Secondary | ICD-10-CM | POA: Insufficient documentation

## 2019-11-07 DIAGNOSIS — F419 Anxiety disorder, unspecified: Secondary | ICD-10-CM | POA: Insufficient documentation

## 2019-11-07 DIAGNOSIS — O09899 Supervision of other high risk pregnancies, unspecified trimester: Secondary | ICD-10-CM | POA: Insufficient documentation

## 2019-11-07 DIAGNOSIS — O09529 Supervision of elderly multigravida, unspecified trimester: Secondary | ICD-10-CM | POA: Insufficient documentation

## 2019-11-07 LAB — RPR+RH+ABO+RUB AB+AB SCR+CB...
Antibody Screen: NEGATIVE
HIV Screen 4th Generation wRfx: NONREACTIVE
Hematocrit: 36.1 % (ref 34.0–46.6)
Hemoglobin: 12 g/dL (ref 11.1–15.9)
Hepatitis B Surface Ag: NEGATIVE
MCH: 29.9 pg (ref 26.6–33.0)
MCHC: 33.2 g/dL (ref 31.5–35.7)
MCV: 90 fL (ref 79–97)
Platelets: 180 10*3/uL (ref 150–450)
RBC: 4.01 x10E6/uL (ref 3.77–5.28)
RDW: 12.5 % (ref 11.7–15.4)
RPR Ser Ql: NONREACTIVE
Rh Factor: POSITIVE
Rubella Antibodies, IGG: 1.52 index (ref >0.99)
Varicella zoster IgG: 905 index (ref >165)
WBC: 9.2 10*3/uL (ref 3.4–10.8)

## 2019-11-07 MED ORDER — HYDROXYPROGESTERONE CAPROATE 250 MG/ML IM OIL: 250.0000 mg | TOPICAL_OIL | Freq: Once | INTRAMUSCULAR | 5 refills | Status: DC

## 2019-11-08 LAB — URINE CULTURE

## 2019-11-09 LAB — NUSWAB BV AND CANDIDA, NAA
Candida albicans, NAA: NEGATIVE
Candida glabrata, NAA: NEGATIVE

## 2019-11-11 LAB — CYTOLOGY - PAP
Chlamydia: NEGATIVE
Comment: NEGATIVE
Comment: NEGATIVE
Comment: NORMAL
Diagnosis: NEGATIVE
High risk HPV: NEGATIVE
Neisseria Gonorrhea: NEGATIVE

## 2019-11-15 ENCOUNTER — Encounter: Payer: Medicaid Other | Admitting: Obstetrics and Gynecology

## 2019-11-15 ENCOUNTER — Ambulatory Visit: Payer: Medicaid Other

## 2019-11-20 ENCOUNTER — Encounter: Payer: Self-pay | Admitting: Advanced Practice Midwife

## 2019-11-20 ENCOUNTER — Other Ambulatory Visit (INDEPENDENT_AMBULATORY_CARE_PROVIDER_SITE_OTHER): Payer: Medicaid Other

## 2019-11-20 ENCOUNTER — Ambulatory Visit (INDEPENDENT_AMBULATORY_CARE_PROVIDER_SITE_OTHER): Payer: Medicaid Other | Admitting: Advanced Practice Midwife

## 2019-11-20 ENCOUNTER — Other Ambulatory Visit: Payer: Self-pay

## 2019-11-20 VITALS — BP 118/74 | Wt 166.0 lb

## 2019-11-20 DIAGNOSIS — R519 Headache, unspecified: Secondary | ICD-10-CM

## 2019-11-20 DIAGNOSIS — O99342 Other mental disorders complicating pregnancy, second trimester: Secondary | ICD-10-CM

## 2019-11-20 DIAGNOSIS — G8929 Other chronic pain: Secondary | ICD-10-CM

## 2019-11-20 DIAGNOSIS — Z3689 Encounter for other specified antenatal screening: Secondary | ICD-10-CM

## 2019-11-20 DIAGNOSIS — O09899 Supervision of other high risk pregnancies, unspecified trimester: Secondary | ICD-10-CM | POA: Diagnosis not present

## 2019-11-20 DIAGNOSIS — O0992 Supervision of high risk pregnancy, unspecified, second trimester: Secondary | ICD-10-CM

## 2019-11-20 DIAGNOSIS — F32A Depression, unspecified: Secondary | ICD-10-CM

## 2019-11-20 DIAGNOSIS — O0990 Supervision of high risk pregnancy, unspecified, unspecified trimester: Secondary | ICD-10-CM | POA: Diagnosis not present

## 2019-11-20 DIAGNOSIS — O09529 Supervision of elderly multigravida, unspecified trimester: Secondary | ICD-10-CM

## 2019-11-20 MED ORDER — BUTALBITAL-APAP-CAFFEINE 50-325-40 MG PO CAPS: 1.0000 | ORAL_CAPSULE | Freq: Four times a day (QID) | ORAL | 3 refills | Status: DC | PRN

## 2019-11-20 MED ORDER — CITALOPRAM HYDROBROMIDE 20 MG PO TABS: 20.0000 mg | ORAL_TABLET | Freq: Every day | ORAL | 12 refills | Status: DC

## 2019-11-20 MED ORDER — HYDROXYPROGESTERONE CAPROATE 250 MG/ML IM OIL
250.0000 mg | TOPICAL_OIL | Freq: Once | INTRAMUSCULAR | Status: AC
  Administered 2019-11-20: 250 mg via INTRAMUSCULAR

## 2019-11-20 NOTE — Progress Notes (Signed)
  Routine Prenatal Care Visit  Subjective  Lindsay Joseph is a 34 y.o. [phone] at [redacted]w[redacted]d being seen today for ongoing prenatal care.  She is currently monitored for the following issues for this high-risk pregnancy and has Restless leg syndrome; Overweight; H/O: depression; Supervision of high risk pregnancy, antepartum; Antepartum multigravida of advanced maternal age; History of preterm delivery, currently pregnant; Anxiety and depression; Headache disorder; Memory difficulty; and Sleep disorder on their problem list.  ----------------------------------------------------------------------------------- Patient reports several complaints: arm numbness, dizziness, vaginal dryness, headaches, nausea with Lexapro. She hasn't been eating well and her appetite is now improving, although when she takes Lexapro she is nauseated. She requests a change of anti-depressant. She has been taking Advil multiple doses per day for her headaches- advised her not to take NSAIDS. She has had vaginal dryness now and prior to pregnancy. She feels dizzy when standing and says that is improving now that she is eating more.  . Vag. Bleeding: None.  Movement: Present. Leaking Fluid denies.  ----------------------------------------------------------------------------------- The following portions of the patient's history were reviewed and updated as appropriate: allergies, current medications, past family history, past medical history, past social history, past surgical history and problem list. Problem list updated.  Objective  Blood pressure 118/74, weight 166 lb (75.3 kg), last menstrual period 07/24/2019 Pregravid weight 175 lb (79.4 kg) Total Weight Gain -9 lb (-4.082 kg) Urinalysis: Urine Protein    Urine Glucose    Fetal Status: Fetal Heart Rate (bpm): 153   Movement: Present      Anatomy/dating: [redacted]w[redacted]d by scan today, complete, normal, female, placenta anterior, no adjustment of EDD for 10 day difference  General:   Alert, oriented and cooperative. Patient is in no acute distress.  Skin: Skin is warm and dry. No rash noted.   Cardiovascular: Normal heart rate noted  Respiratory: Normal respiratory effort, no problems with respiration noted  Abdomen: Soft, gravid, appropriate for gestational age.       Pelvic:  Cervical exam deferred        Extremities: Normal range of motion.     Mental Status: Normal mood and affect. Normal behavior. Normal judgment and thought content.   Assessment   34 y.o. [id number] at [redacted]w[redacted]d by  04/29/2020, by Last Menstrual Period presenting for routine prenatal visit  Plan   Nausea with Lexapro: change to Celexa Headaches: increase hydration, take OTC magnesium 250 mg daily, fioricet as needed, do not take NSAIDS Vaginal dryness: use lubricant, consider estrogen postpartum Dizziness: stay hydrated, eat protein with each meal and snack  Makena: first injection today Betamethasone as needed  Preterm labor symptoms and general obstetric precautions including but not limited to vaginal bleeding, contractions, leaking of fluid and fetal movement were reviewed in detail with the patient.   Return in about 4 weeks (around 12/18/2019) for rob and weekly makena injections.  Tresea Mall, CNM 11/20/2019 5:23 PM

## 2019-11-20 NOTE — Progress Notes (Signed)
No vb. No lof. Dating scan today. Pt would like to discuss changing her depression medication. Vaginal dryness.

## 2019-11-28 ENCOUNTER — Encounter: Payer: Medicaid Other | Admitting: Advanced Practice Midwife

## 2019-11-28 ENCOUNTER — Other Ambulatory Visit: Payer: Self-pay

## 2019-11-28 VITALS — BP 128/70 | Ht 63.5 in | Wt 163.4 lb

## 2019-11-28 MED ORDER — HYDROXYPROGESTERONE CAPROATE 250 MG/ML IM OIL
250.0000 mg | TOPICAL_OIL | Freq: Once | INTRAMUSCULAR | Status: AC
  Administered 2019-11-28: 250 mg via INTRAMUSCULAR

## 2019-11-28 NOTE — Progress Notes (Signed)
Pt here for ROB 17 p inj .

## 2019-11-29 NOTE — Progress Notes (Signed)
This encounter was created in error - please disregard.

## 2019-12-05 ENCOUNTER — Encounter: Payer: Medicaid Other | Admitting: Obstetrics and Gynecology

## 2019-12-06 ENCOUNTER — Ambulatory Visit: Payer: Medicaid Other

## 2019-12-11 ENCOUNTER — Ambulatory Visit: Payer: Medicaid Other

## 2019-12-16 ENCOUNTER — Ambulatory Visit (INDEPENDENT_AMBULATORY_CARE_PROVIDER_SITE_OTHER): Payer: Medicaid Other | Admitting: Obstetrics and Gynecology

## 2019-12-16 ENCOUNTER — Other Ambulatory Visit: Payer: Self-pay

## 2019-12-16 ENCOUNTER — Encounter: Payer: Self-pay | Admitting: Obstetrics and Gynecology

## 2019-12-16 ENCOUNTER — Other Ambulatory Visit (HOSPITAL_COMMUNITY)
Admission: RE | Admit: 2019-12-16 | Discharge: 2019-12-16 | Disposition: A | Discharge status: Home / Self Care | Payer: Medicaid Other | Source: Ambulatory Visit | Attending: Obstetrics and Gynecology | Admitting: Obstetrics and Gynecology

## 2019-12-16 VITALS — BP 122/74 | Wt 166.0 lb

## 2019-12-16 DIAGNOSIS — R350 Frequency of micturition: Secondary | ICD-10-CM

## 2019-12-16 DIAGNOSIS — O26892 Other specified pregnancy related conditions, second trimester: Secondary | ICD-10-CM

## 2019-12-16 DIAGNOSIS — Z3A20 20 weeks gestation of pregnancy: Secondary | ICD-10-CM

## 2019-12-16 DIAGNOSIS — O4702 False labor before 37 completed weeks of gestation, second trimester: Secondary | ICD-10-CM

## 2019-12-16 DIAGNOSIS — O0992 Supervision of high risk pregnancy, unspecified, second trimester: Secondary | ICD-10-CM

## 2019-12-16 DIAGNOSIS — N898 Other specified noninflammatory disorders of vagina: Secondary | ICD-10-CM

## 2019-12-16 DIAGNOSIS — O09899 Supervision of other high risk pregnancies, unspecified trimester: Secondary | ICD-10-CM

## 2019-12-16 DIAGNOSIS — O09522 Supervision of elderly multigravida, second trimester: Secondary | ICD-10-CM

## 2019-12-16 LAB — POCT URINALYSIS DIPSTICK OB
Bilirubin, UA: NEGATIVE
Blood, UA: NEGATIVE
Glucose, UA: NEGATIVE
Ketones, UA: NEGATIVE
Nitrite, UA: NEGATIVE
POC,PROTEIN,UA: NEGATIVE
Spec Grav, UA: 1.02 (ref 1.010–1.025)
Urobilinogen, UA: NEGATIVE E.U./dL — AB
pH, UA: 6 (ref 5.0–8.0)

## 2019-12-16 NOTE — Progress Notes (Signed)
Routine Prenatal Care Visit  Subjective  Lindsay Joseph is a 34 y.o. [phone] at [redacted]w[redacted]d being seen today for ongoing prenatal care.  She is currently monitored for the following issues for this high-risk pregnancy and has Restless leg syndrome; Overweight; H/O: depression; Supervision of high risk pregnancy, antepartum; Antepartum multigravida of advanced maternal age; History of preterm delivery, currently pregnant; Anxiety and depression; Headache disorder; Memory difficulty; and Sleep disorder on their problem list.  ----------------------------------------------------------------------------------- Patient reports contractions 3 days ago that stopped. Her contractions started again this morning at 3AM, lasting 20 seconds, about 5 minutes apart.  She notes a large discharge of mucus tinged with what appeared to be blood.  Denies vaginal itching, burning, and irritation. She denies new urinary symptoms. She denies GI symptoms.   Contractions: Irritability. Vag. Bleeding: Small.  Movement: Present. Leaking Fluid denies.  ----------------------------------------------------------------------------------- The following portions of the patient's history were reviewed and updated as appropriate: allergies, current medications, past family history, past medical history, past social history, past surgical history and problem list. Problem list updated.  Objective  Blood pressure 122/74, weight 166 lb (75.3 kg), last menstrual period 07/24/2019, unknown if currently breastfeeding. Pregravid weight 175 lb (79.4 kg) Total Weight Gain -9 lb (-4.082 kg) Urinalysis: Urine Protein    Urine Glucose    Fetal Status: Fetal Heart Rate (bpm): 150   Movement: Present     General:  Alert, oriented and cooperative. Patient is in no acute distress.  Skin: Skin is warm and dry. No rash noted.   Cardiovascular: Normal heart rate noted  Respiratory: Normal respiratory effort, no problems with respiration noted   Abdomen: Soft, gravid, appropriate for gestational age. Pain/Pressure: Present     Pelvic:  Cervical exam performed Dilation: Closed Effacement (%): Thick Station: Ballotable, no bleeding noted, no blood noted.   Extremities: Normal range of motion.     Mental Status: Normal mood and affect. Normal behavior. Normal judgment and thought content.   Wet Prep: PH: 7 Clue Cells: Negative Fungal elements: Negative Trichomonas: Negative   UA: pH 6.0 LE: positive Others: normal  Assessment   34 y.o. [id number] at [redacted]w[redacted]d by  04/29/2020, by Last Menstrual Period presenting for work-in prenatal visit  Plan   pregnancy 9 Problems (from 07/24/19 to present)    Problem Noted Resolved   Supervision of high risk pregnancy, antepartum 11/07/2019 by Vena Austria, MD No   Overview Signed 12/16/2019  8:52 AM by Conard Novak, MD    [facility]inic [facility] Prenatal Labs  Dating  Blood type: A/Positive/-- (10/20 1446)   Genetic Screen 1 Screen:    AFP:     Quad:     NIPS: Antibody:Negative (10/20 1446)  Anatomic [place]  Rubella: 1.52 (10/20 1446) Varicella: @VZVIGG @  GTT Early:               Third trimester:  RPR: Non Reactive (10/20 1446)   Rhogam  HBsAg: Negative (10/20 1446)   TDaP vaccine                       Flu Shot: HIV: Non Reactive (10/20 1446)   Baby Food                                GBS:   Contraception  Pap:  CBB     CS/VBAC    Support Person  Antepartum multigravida of advanced maternal age 79/21/2021 by Vena Austria, MD No   History of preterm delivery, currently pregnant 11/07/2019 by Vena Austria, MD No       Preterm labor symptoms and general obstetric precautions including but not limited to vaginal bleeding, contractions, leaking of fluid and fetal movement were reviewed in detail with the patient. Please refer to After Visit Summary for other counseling recommendations.   Patient reassured about symptoms. Given her history, precautions given.  Encouraged use of 17OHP.  Will send aptima and urine cx.  Blood type A+  Return in about 2 days (around 12/18/2019) for Routine Prenatal Appointment (Keep previously scheduled appt).   Thomasene Mohair, MD, [facility], [facility] 12/16/2019 8:50 AM

## 2019-12-17 ENCOUNTER — Encounter: Payer: Self-pay | Admitting: Advanced Practice Midwife

## 2019-12-17 ENCOUNTER — Observation Stay
Admission: EM | Admit: 2019-12-17 | Discharge: 2019-12-17 | Disposition: A | Discharge status: Home / Self Care | Payer: Medicaid Other | Attending: Advanced Practice Midwife | Admitting: Advanced Practice Midwife

## 2019-12-17 DIAGNOSIS — Z3A20 20 weeks gestation of pregnancy: Secondary | ICD-10-CM | POA: Diagnosis not present

## 2019-12-17 DIAGNOSIS — O4192X0 Disorder of amniotic fluid and membranes, unspecified, second trimester, not applicable or unspecified: Secondary | ICD-10-CM | POA: Diagnosis not present

## 2019-12-17 DIAGNOSIS — O42912 Preterm premature rupture of membranes, unspecified as to length of time between rupture and onset of labor, second trimester: Secondary | ICD-10-CM | POA: Diagnosis not present

## 2019-12-17 DIAGNOSIS — Z20822 Contact with and (suspected) exposure to covid-19: Secondary | ICD-10-CM | POA: Diagnosis not present

## 2019-12-17 LAB — CERVICOVAGINAL ANCILLARY ONLY
Chlamydia: NEGATIVE
Comment: NEGATIVE
Comment: NEGATIVE
Comment: NORMAL
Neisseria Gonorrhea: NEGATIVE
Trichomonas: NEGATIVE

## 2019-12-17 LAB — GROUP B STREP BY PCR: Group B strep by PCR: NEGATIVE

## 2019-12-17 LAB — CBC
HCT: 33.1 % — ABNORMAL LOW (ref 36.0–46.0)
Hemoglobin: 11.2 g/dL — ABNORMAL LOW (ref 12.0–15.0)
MCH: 30.4 pg (ref 26.0–34.0)
MCHC: 33.8 g/dL (ref 30.0–36.0)
MCV: 89.9 fL (ref 80.0–100.0)
Platelets: 163 10*3/uL (ref 150–400)
RBC: 3.68 MIL/uL — ABNORMAL LOW (ref 3.87–5.11)
RDW: 13 % (ref 11.5–15.5)
WBC: 13.5 10*3/uL — ABNORMAL HIGH (ref 4.0–10.5)
nRBC: 0 % (ref 0.0–0.2)

## 2019-12-17 LAB — URINALYSIS, COMPLETE (UACMP) WITH MICROSCOPIC
Bilirubin Urine: NEGATIVE
Glucose, UA: NEGATIVE mg/dL
Ketones, ur: 5 mg/dL — AB
Nitrite: NEGATIVE
Protein, ur: NEGATIVE mg/dL
Specific Gravity, Urine: 1.023 (ref 1.005–1.030)
pH: 6 (ref 5.0–8.0)

## 2019-12-17 LAB — CHLAMYDIA/NGC RT PCR (ARMC ONLY)
Chlamydia Tr: NOT DETECTED
N gonorrhoeae: NOT DETECTED

## 2019-12-17 LAB — RESP PANEL BY RT-PCR (FLU A&B, COVID) ARPGX2
Influenza A by PCR: NEGATIVE
Influenza B by PCR: NEGATIVE
SARS Coronavirus 2 by RT PCR: NEGATIVE

## 2019-12-17 LAB — WET PREP, GENITAL
Clue Cells Wet Prep HPF POC: NONE SEEN
Sperm: NONE SEEN
Trich, Wet Prep: NONE SEEN
Yeast Wet Prep HPF POC: NONE SEEN

## 2019-12-17 LAB — ABO/RH: ABO/RH(D): A POS

## 2019-12-17 LAB — FETAL FIBRONECTIN: Fetal Fibronectin: POSITIVE — AB

## 2019-12-17 LAB — RUPTURE OF MEMBRANE (ROM)PLUS: Rom Plus: POSITIVE

## 2019-12-17 MED ORDER — SODIUM CHLORIDE 0.9 % IV SOLN
250.0000 mg | Freq: Four times a day (QID) | INTRAVENOUS | Status: DC
  Administered 2019-12-17: 250 mg via INTRAVENOUS
  Filled 2019-12-17 (×5): qty 5

## 2019-12-17 MED ORDER — BUTORPHANOL TARTRATE 1 MG/ML IJ SOLN
1.0000 mg | INTRAMUSCULAR | Status: DC | PRN
  Administered 2019-12-17 (×3): 1 mg via INTRAVENOUS
  Filled 2019-12-17 (×2): qty 1

## 2019-12-17 MED ORDER — LACTATED RINGERS IV BOLUS
500.0000 mL | Freq: Once | INTRAVENOUS | Status: AC
  Administered 2019-12-17: 500 mL via INTRAVENOUS

## 2019-12-17 MED ORDER — SODIUM CHLORIDE 0.9 % IV SOLN
2.0000 g | Freq: Four times a day (QID) | INTRAVENOUS | Status: DC
  Administered 2019-12-17: 2 g via INTRAVENOUS
  Filled 2019-12-17: qty 2000

## 2019-12-17 MED ORDER — LACTATED RINGERS IV SOLN: INTRAVENOUS | Status: DC

## 2019-12-17 MED ORDER — BUTORPHANOL TARTRATE 1 MG/ML IJ SOLN
INTRAMUSCULAR | Status: AC
  Filled 2019-12-17: qty 1

## 2019-12-17 MED ORDER — HYDROXYZINE HCL 25 MG PO TABS
25.0000 mg | ORAL_TABLET | Freq: Four times a day (QID) | ORAL | Status: DC | PRN
  Filled 2019-12-17: qty 1

## 2019-12-17 MED ORDER — OXYCODONE-ACETAMINOPHEN 5-325 MG PO TABS: 1.0000 | ORAL_TABLET | Freq: Four times a day (QID) | ORAL | Status: DC | PRN

## 2019-12-17 NOTE — Progress Notes (Signed)
Informed by nursing that patient was planning on leaving against medical advise.  I went to see the patient and expressed my concerns that she had chorioamnionitis and again recommended she should be induced because of risks related to sepsis and maternal death. Discussed that chorioamnionitis is life threatening. Also discussed with the patient that if she was to have prolonged chorioamnionitis she could require a hysterectomy that would limit her future ability to have a child. She was aware of these possible ramifications. She still desired to be discharged home. She said that she could not consider inducing her pregnancy while the baby was still alive although she did state "this baby probably won't live either way."  I recommended continuing her admission to monitor for worsening infection with repeat CBC and temperature checks, but she declined.   She has a complex social situation. Nursing reports the patient may of been due in court today and that there might be a warrant out for her arrest now. There has also been social issues between the father of the baby and the patient. Patient was given a note to show she was in the hospital today.  Encouraged the patient to monitor herself for infection and to return to the hospital if she has increased pain, fever, or bleeding.  Patient stated she would keep her appointment for tomorrow in our office.   I updated MFM at [facility] regarding this patient. If she stays pregnant until 22 weeks she will desire inpatient admission there. They will reach out to her regarding transfer of care as they said they will likely follow up with her outpatient prior to admission.   Patient left against medical advise.  Lindsay Idler MD, [facility], [facility] 12/17/2019 2:11 PM

## 2019-12-17 NOTE — OB Triage Note (Addendum)
Spoke with Higinio Roger about patients chief complaint and provider stated she will put in observation orders and will be by to see patient. Plans for sterile speculum exam, swabs.

## 2019-12-17 NOTE — Progress Notes (Signed)
Patient reports feeling pain with contractions. RN administered LR bolus as ordered and spoke to CNM regarding pain medication. CNM ordered stadol and RN administered it per order. Patient educated on medication and requests medication to be given. Patient states to RN that she wanted to be transferred to [facility]. She states that she will either "be transferred or leave and drive herself to [facility]". RN has Jerene Pitch, MD come to bedside to answer patient's questions and discuss a plan of care.

## 2019-12-17 NOTE — Progress Notes (Signed)
Patient presented to the hospital today with complaints of leakage of fluid. She was found to be grossly ruptured with pooling, positive ferning and a positive Rom plus. She has been having some contractions. Stadol has helped improve her pain. Speculum exam by midwife showed patient to be visually 1 cm.   Bedside US shows Homero Fellers Breech position. Minimal to no fluid surrounding baby. Femur length measures 21 weeks 6 days.   Patient was started on IV ampicillin and erythromycin.  Discussed that given her early gestational age tocolysis and betamethasone are not indicated per ACOG recommendations. .   Discussed that she may be progressing to labor. Offered epidural if pain becomes severe.   Offered patient consultation with NICU, but she declines.   Patient expressed that she desires transfer to [facility]. I placed a call to the [facility] transfer center. I am waiting for a return call. Patient desires transfer to [facility] if possible. If she is not able to be transferred she would like to be discharged so that she can drive there herself.   Added on labs for cbc, type and screen, and covid testing.   Adelene Idler MD, [facility], [facility] 12/17/2019 9:03 AM

## 2019-12-17 NOTE — Progress Notes (Signed)
I spoke with [facility] MFM fellow regarding Lindsay Joseph and transfer to [facility].  They did a phone video consultation with the patient. They discussed with the patient that they would not do anything different with her care. They would not offer betamethasone. They would not offer tocolysis. They would not offer magnesium. They said that they would not generally offer antibiotics until the patient is [redacted] weeks gestation. [facility] felt that it was important for the patient to understand this prior to considering transfer.   The MFM fellow requested that I check the patient. I told her that I would perform a speculum exam and see if she is dilated or if there  Is a presenting part.   Speculum exam showed yellow discharge, but patient was not visually dilated and cervix appeared closed.  Patient reports that yellow discharge started yesterday. She does have some uterine tenderness.   Patient is going through social difficulties. Father of the baby left with the patient's car and went to see another women. Patient was tearfully yelling on the phone.   I spoke with the patient regarding her current situation and her options. I would like the patient to stay and be monitored for labor and infection for the next 24-48 hours.   I am concerned she could be developing chorioamnionitis. I spoke with the patient regarding this. Will monitor closely for fever. Discussed options with induction in the setting of previable prelabor rupture of membranes especially in the setting of possible chorioamnionitis. Patient will consider.   [name] desires to leave for home and would prefer to wait until [redacted] weeks gestation when she would then go to [facility] for admission.   I discussed with the patient that if she were to go home she would need to monitor herself closely for signs of infection by checking her temperature twice a day and watching for uterine tenderness.    Adelene Idler MD, [facility], [facility] 12/17/2019 10:48 AM

## 2019-12-17 NOTE — OB Triage Note (Signed)
Patient arrived to unit with complaints of ruptured membranes. Patient states she was seen in office yesterday and was told she was fine. Patient had intercourse yesterday. Patient reports her cervix was closed on yesterday's OB outpatient visit. Patient reprots she checked herself prior to coming today and was .5cm dilated. Patient was strongly encouraged not to perform cervical exams especially given she has long acrylic nails and could risk puncturing her amniotic sac. Patient placed on EFM and TOCO to non tender area of abdomen. Wil notify provider on call of patients arrival. Small clear fluid noted on a towel between patient's legs and a small splatter of clear fluid on peri pad when removed from her legs. Patient history reviewed.

## 2019-12-17 NOTE — OB Triage Note (Signed)
Lindsay Joseph at bedside evaluating pt's chief complaint.
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
## 2019-12-17 NOTE — Progress Notes (Signed)
Patient called out to nurse to let her know that she wanted to leave and go home. RN educated patient and let her know that she would be leaving against medical advice. The patient states that she is aware and still wants to go home. The MD is aware and is planning to come talk to the patient. The patient states that she has a ride home and is encouraged not to drive since she has had Stadol IV administered today. Patient verbalized understanding and stated that she was not going to drive.

## 2019-12-18 ENCOUNTER — Encounter: Payer: Medicaid Other | Admitting: Obstetrics

## 2019-12-18 LAB — URINE CULTURE

## 2019-12-18 LAB — CULTURE, OB URINE: Culture: NO GROWTH

## 2019-12-18 LAB — TYPE AND SCREEN
ABO/RH(D): A POS
Antibody Screen: NEGATIVE

## 2019-12-20 ENCOUNTER — Telehealth: Payer: Self-pay

## 2019-12-20 NOTE — Telephone Encounter (Signed)
Received call from Lindsay Joseph stating pt will be readmitted on 12/23/19. She will stay in patient with [name] the duration of her pregnancy. She will no longer need Ob apts with [place].

## 2020-01-05 ENCOUNTER — Encounter: Payer: Self-pay | Admitting: Emergency Medicine

## 2020-01-05 ENCOUNTER — Other Ambulatory Visit: Payer: Self-pay

## 2020-01-05 ENCOUNTER — Emergency Department: Payer: Medicaid Other

## 2020-01-05 ENCOUNTER — Emergency Department
Admission: EM | Admit: 2020-01-05 | Discharge: 2020-01-06 | Disposition: A | Discharge status: Home / Self Care | Payer: Medicaid Other | Attending: Emergency Medicine | Admitting: Emergency Medicine

## 2020-01-05 DIAGNOSIS — F10929 Alcohol use, unspecified with intoxication, unspecified: Secondary | ICD-10-CM | POA: Diagnosis not present

## 2020-01-05 DIAGNOSIS — F53 Postpartum depression: Secondary | ICD-10-CM | POA: Diagnosis not present

## 2020-01-05 DIAGNOSIS — Z20822 Contact with and (suspected) exposure to covid-19: Secondary | ICD-10-CM | POA: Insufficient documentation

## 2020-01-05 DIAGNOSIS — E860 Dehydration: Secondary | ICD-10-CM | POA: Insufficient documentation

## 2020-01-05 DIAGNOSIS — F410 Panic disorder [episodic paroxysmal anxiety] without agoraphobia: Secondary | ICD-10-CM | POA: Diagnosis not present

## 2020-01-05 DIAGNOSIS — Z79899 Other long term (current) drug therapy: Secondary | ICD-10-CM | POA: Insufficient documentation

## 2020-01-05 DIAGNOSIS — F32A Depression, unspecified: Secondary | ICD-10-CM | POA: Diagnosis present

## 2020-01-05 DIAGNOSIS — F10920 Alcohol use, unspecified with intoxication, uncomplicated: Secondary | ICD-10-CM

## 2020-01-05 LAB — CBC
HCT: 38.8 % (ref 36.0–46.0)
Hemoglobin: 13.1 g/dL (ref 12.0–15.0)
MCH: 29.8 pg (ref 26.0–34.0)
MCHC: 33.8 g/dL (ref 30.0–36.0)
MCV: 88.4 fL (ref 80.0–100.0)
Platelets: 380 10*3/uL (ref 150–400)
RBC: 4.39 MIL/uL (ref 3.87–5.11)
RDW: 12.2 % (ref 11.5–15.5)
WBC: 8.8 10*3/uL (ref 4.0–10.5)
nRBC: 0 % (ref 0.0–0.2)

## 2020-01-05 LAB — RESP PANEL BY RT-PCR (FLU A&B, COVID) ARPGX2
Influenza A by PCR: NEGATIVE
Influenza B by PCR: NEGATIVE
SARS Coronavirus 2 by RT PCR: NEGATIVE

## 2020-01-05 LAB — BLOOD GAS, VENOUS
Acid-base deficit: 4.4 mmol/L — ABNORMAL HIGH (ref 0.0–2.0)
Bicarbonate: 20.2 mmol/L (ref 20.0–28.0)
O2 Saturation: 93.2 %
Patient temperature: 37
pCO2, Ven: 35 mmHg — ABNORMAL LOW (ref 44.0–60.0)
pH, Ven: 7.37 (ref 7.250–7.430)
pO2, Ven: 70 mmHg — ABNORMAL HIGH (ref 32.0–45.0)

## 2020-01-05 LAB — CBC WITH DIFFERENTIAL/PLATELET
Abs Immature Granulocytes: 0.22 10*3/uL — ABNORMAL HIGH (ref 0.00–0.07)
Basophils Absolute: 0.1 10*3/uL (ref 0.0–0.1)
Basophils Relative: 1 %
Eosinophils Absolute: 0.2 10*3/uL (ref 0.0–0.5)
Eosinophils Relative: 2 %
HCT: 38.1 % (ref 36.0–46.0)
Hemoglobin: 12.7 g/dL (ref 12.0–15.0)
Immature Granulocytes: 3 %
Lymphocytes Relative: 33 %
Lymphs Abs: 2.9 10*3/uL (ref 0.7–4.0)
MCH: 29.5 pg (ref 26.0–34.0)
MCHC: 33.3 g/dL (ref 30.0–36.0)
MCV: 88.6 fL (ref 80.0–100.0)
Monocytes Absolute: 0.5 10*3/uL (ref 0.1–1.0)
Monocytes Relative: 5 %
Neutro Abs: 5 10*3/uL (ref 1.7–7.7)
Neutrophils Relative %: 56 %
Platelets: 377 10*3/uL (ref 150–400)
RBC: 4.3 MIL/uL (ref 3.87–5.11)
RDW: 12.2 % (ref 11.5–15.5)
WBC: 8.8 10*3/uL (ref 4.0–10.5)
nRBC: 0 % (ref 0.0–0.2)

## 2020-01-05 LAB — ETHANOL: Alcohol, Ethyl (B): 177 mg/dL — ABNORMAL HIGH (ref <10)

## 2020-01-05 LAB — LACTIC ACID, PLASMA
Lactic Acid, Venous: 3 mmol/L (ref 0.5–1.9)
Lactic Acid, Venous: 1.1 mmol/L (ref 0.5–1.9)

## 2020-01-05 LAB — SALICYLATE LEVEL: Salicylate Lvl: <7 mg/dL — ABNORMAL LOW (ref 7.0–30.0)

## 2020-01-05 LAB — COMPREHENSIVE METABOLIC PANEL
ALT: 14 U/L (ref 0–44)
AST: 16 U/L (ref 15–41)
Albumin: 3.4 g/dL — ABNORMAL LOW (ref 3.5–5.0)
Alkaline Phosphatase: 65 U/L (ref 38–126)
Anion gap: 14 (ref 5–15)
BUN: 17 mg/dL (ref 6–20)
CO2: 18 mmol/L — ABNORMAL LOW (ref 22–32)
Calcium: 9 mg/dL (ref 8.9–10.3)
Chloride: 107 mmol/L (ref 98–111)
Creatinine, Ser: 0.86 mg/dL (ref 0.44–1.00)
GFR, Estimated: >60 mL/min (ref >60)
Glucose, Bld: 94 mg/dL (ref 70–99)
Potassium: 3.4 mmol/L — ABNORMAL LOW (ref 3.5–5.1)
Sodium: 139 mmol/L (ref 135–145)
Total Bilirubin: 0.5 mg/dL (ref 0.3–1.2)
Total Protein: 7.5 g/dL (ref 6.5–8.1)

## 2020-01-05 LAB — URINE DRUG SCREEN, QUALITATIVE (ARMC ONLY)
Amphetamines, Ur Screen: NOT DETECTED
Barbiturates, Ur Screen: NOT DETECTED
Benzodiazepine, Ur Scrn: NOT DETECTED
Cannabinoid 50 Ng, Ur ~~LOC~~: NOT DETECTED
Cocaine Metabolite,Ur ~~LOC~~: NOT DETECTED
MDMA (Ecstasy)Ur Screen: NOT DETECTED
Methadone Scn, Ur: NOT DETECTED
Opiate, Ur Screen: NOT DETECTED
Phencyclidine (PCP) Ur S: NOT DETECTED
Tricyclic, Ur Screen: NOT DETECTED

## 2020-01-05 LAB — TROPONIN I (HIGH SENSITIVITY): Troponin I (High Sensitivity): 4 ng/L (ref <18)

## 2020-01-05 LAB — ACETAMINOPHEN LEVEL: Acetaminophen (Tylenol), Serum: <10 ug/mL — ABNORMAL LOW (ref 10–30)

## 2020-01-05 LAB — PREGNANCY, URINE: Preg Test, Ur: NEGATIVE

## 2020-01-05 MED ORDER — DIPHENHYDRAMINE HCL 50 MG/ML IJ SOLN: 25.0000 mg | Freq: Once | INTRAMUSCULAR | Status: DC

## 2020-01-05 MED ORDER — IOHEXOL 350 MG/ML SOLN
75.0000 mL | Freq: Once | INTRAVENOUS | Status: AC | PRN
  Administered 2020-01-05: 08:00:00 75 mL via INTRAVENOUS

## 2020-01-05 MED ORDER — HALOPERIDOL LACTATE 5 MG/ML IJ SOLN
5.0000 mg | Freq: Once | INTRAMUSCULAR | Status: AC
  Administered 2020-01-05: 10:00:00 5 mg via INTRAMUSCULAR
  Filled 2020-01-05: qty 1

## 2020-01-05 MED ORDER — ACETAMINOPHEN 325 MG PO TABS: 650.0000 mg | ORAL_TABLET | ORAL | Status: DC | PRN

## 2020-01-05 MED ORDER — LORAZEPAM 2 MG/ML IJ SOLN: 2.0000 mg | Freq: Once | INTRAMUSCULAR | Status: DC

## 2020-01-05 MED ORDER — DIPHENHYDRAMINE HCL 12.5 MG/5ML PO ELIX: 25.0000 mg | ORAL_SOLUTION | Freq: Once | ORAL | Status: DC

## 2020-01-05 MED ORDER — LACTATED RINGERS IV BOLUS
1000.0000 mL | Freq: Once | INTRAVENOUS | Status: AC
  Administered 2020-01-05: 08:00:00 1000 mL via INTRAVENOUS

## 2020-01-05 MED ORDER — HALOPERIDOL LACTATE 5 MG/ML IJ SOLN
2.5000 mg | Freq: Once | INTRAMUSCULAR | Status: DC
  Filled 2020-01-05: qty 1

## 2020-01-05 MED ORDER — DIPHENHYDRAMINE HCL 50 MG/ML IJ SOLN
INTRAMUSCULAR | Status: AC
  Filled 2020-01-05: qty 1

## 2020-01-05 MED ORDER — LORAZEPAM 2 MG/ML IJ SOLN
0.5000 mg | Freq: Once | INTRAMUSCULAR | Status: AC
  Administered 2020-01-05: 08:00:00 0.5 mg via INTRAVENOUS
  Filled 2020-01-05: qty 1

## 2020-01-05 MED ORDER — BREAST MILK/FORMULA (FOR LABEL PRINTING ONLY): ORAL | Status: DC

## 2020-01-05 MED ORDER — DIPHENHYDRAMINE HCL 50 MG/ML IJ SOLN
25.0000 mg | Freq: Once | INTRAMUSCULAR | Status: AC
  Administered 2020-01-05: 10:00:00 25 mg via INTRAMUSCULAR
  Filled 2020-01-05: qty 1

## 2020-01-05 MED ORDER — ALUM & MAG HYDROXIDE-SIMETH 200-200-20 MG/5ML PO SUSP: 30.0000 mL | Freq: Four times a day (QID) | ORAL | Status: DC | PRN

## 2020-01-05 MED ORDER — LACTATED RINGERS IV BOLUS: 1000.0000 mL | Freq: Once | INTRAVENOUS | Status: DC

## 2020-01-05 MED ORDER — SODIUM CHLORIDE 0.9 % IV BOLUS: 1000.0000 mL | Freq: Once | INTRAVENOUS | Status: DC

## 2020-01-05 MED ORDER — ONDANSETRON HCL 4 MG PO TABS: 4.0000 mg | ORAL_TABLET | Freq: Three times a day (TID) | ORAL | Status: DC | PRN

## 2020-01-05 NOTE — ED Notes (Signed)
Charge RN Marylene Land aware pt is IVC.

## 2020-01-05 NOTE — ED Notes (Signed)
Nurse at bedside to assist patient with pumping. Supplies at bedside at this time.

## 2020-01-05 NOTE — ED Notes (Addendum)
This RN, Beecher Mcardle RN, and SOs at bedside at this time to administer medication. Pt initially refusing medication but explained to pt that she did not have a choice at this point. Pt held down on the bed at this time to administer IM medication.  After administration of medication pt got very angry and cussing, yelling, kicking, and flaying arms at officers at bedside. Pt stating, "you see how he is handling me. Are you serious?" pt continuously trying to buck up to officers and cursing towards staff. Pt was held down to the bed again and then again onto the floor until restraints were able to be obtained. Pt was held down for approx 30 second to received medications. This RN, Marylene Land, RN and Beecher Mcardle, RN at bedside and try to reassure pt that she didn't need to be kicking and trying to fight the officers. Pt screaming, "my leg is bleeding and milk is coming from my breast." Officer removed from the room at this point. Reassured pt that we would get her a new set of clothes and underwear. Pt very upset and the previous events and began to hyperventilate. Instructed pt to calm down her breathing. Assisted pt into new clothes and cleaned the blood off the pt's legs. After calming down the pt there was not need for restraints at this time.

## 2020-01-05 NOTE — ED Notes (Signed)
Pt expressing desire to leave, advised her to speak with MD at this time, security notified that patient wanting to leave. Pt under IVC at this time.

## 2020-01-05 NOTE — ED Notes (Signed)
Pt had been sleeping and woke up asking when she will be seen as she needs to leave to go see her children.  Pt also states that she needs to be at work this pm.  Spoke with her about how she was not well at all when she came in and that we are concerned and need to help her and that I do not anticipate that she will go home today.  Pt also states that she pumps for her NICU baby, told her I would work on getting a pump for her. Called [facility] and they are going to send a RN down to bring a pump and speak with her.

## 2020-01-05 NOTE — ED Notes (Signed)
Pt moving around in bed independently, resps unlabored.

## 2020-01-05 NOTE — ED Notes (Signed)
Pt declines offer for nightime snack.
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
## 2020-01-05 NOTE — ED Notes (Signed)
Pt alert and answering questions in room. Pt reports that she was discharged from [facility] on Friday where pt was in patient post vaginal delivery at 23 weeks.

## 2020-01-05 NOTE — ED Notes (Signed)
RT notified [facility] sent to lab  

## 2020-01-05 NOTE — ED Notes (Signed)
Pt continues to be upset about process of IVC and demanding to see the court order. Pt also worried about pumping tomorrow.  Pt has black t-shirt in bag and threw underwear in the garbage.

## 2020-01-05 NOTE — ED Notes (Signed)
At approx 705 pt noted to have O2 sats at 46%, pt not breathing, and pt face appears gray.  Pt placed on non-rebreather at 15 lpm and sternal rub, pt woke gasping, then approx 5 minutes later pt noted to have stopped breathing and again woke to gasping from sternal rub. Request for medical room allowed and pt moved to room 4.  New RN Sonny Dandy and Dr Erma Heritage and boyfriend updated. Pt no longer non-verbal.

## 2020-01-05 NOTE — ED Notes (Signed)
Pt given the phone to talk to significant other at this time.

## 2020-01-05 NOTE — ED Notes (Signed)
Dr. Penne Lash, MD at bedside for reevaluation

## 2020-01-05 NOTE — ED Notes (Signed)
Pt repeatedly yelling and cussing at this RN at this time. Pt is kicking the garage door.
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
## 2020-01-05 NOTE — ED Notes (Signed)
Supervised visit from pt's significant other at this time, Harriett Rush, pt relations supervised this pt's visit at this time.

## 2020-01-05 NOTE — ED Notes (Signed)
Pt tearful talking to EDP

## 2020-01-05 NOTE — ED Notes (Signed)
Lindsay Joseph SO phone number [phone]

## 2020-01-05 NOTE — ED Notes (Signed)
Dr. Erma Heritage at bedside explaining to pt about needing to stay and that she has to wait for psychiatry to see her.

## 2020-01-05 NOTE — Lactation Note (Signed)
Lactation Consultation Note  Patient Name: Lindsay Joseph Date: 01/05/2020   Micah Flesher to ED to assist mom with pumping.  [facility] set up with instructions in breast massage, hand expression, pumping, collection, storage, cleaning, labeling and handling of expressed milk.  Mom expressed 4 oz at this time.  Breasts were softer after pumping.  There is no one her with mom to take expressed milk to baby so placed in nursery freezer.  Encouraged mom and nurse caring for mom to call lactation with any questions, concerns or assistance.  Maternal Data    Feeding    LATCH Score                   Interventions    Lactation Tools Discussed/Used     Consult Status      Louis Meckel 01/05/2020, 7:51 PM

## 2020-01-05 NOTE — ED Notes (Addendum)
Lunch tray given to patient at this time. Pt refused but meal tray still placed at the side of the bed.

## 2020-01-05 NOTE — ED Notes (Signed)
SOC machine placed into pt's room at this time for evaluation.

## 2020-01-05 NOTE — ED Notes (Signed)
SOC completed, camera removed from room. Pt tearful, states "I don't know what I did to deserve this, all I did was have a baby". Pt denies needs at this time, resps unlabored, skin pwd.

## 2020-01-05 NOTE — ED Notes (Signed)
Report received from Sonny Dandy., RN
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
## 2020-01-05 NOTE — ED Notes (Signed)
Report from [place], [place]. Pt is currently being seen by [facility].

## 2020-01-05 NOTE — ED Notes (Signed)
Pt ambulatory to the restroom at this time.  

## 2020-01-05 NOTE — ED Notes (Addendum)
Pt upset about having stay under court order, explained process to patient about dressing out and moving to behavioral area. pt states "I am to see the court order" advised her that its on the chart. Pt started getting belligerent, she has pulled out her IV.  Visitation has been discussed with SO, pt cursing at staff stating "I better see my fucking kids tomorrow" pt also making statements about how security will need back up and making comments about how we aren't letting her see her child that is in the NICU. Pt frequently referring to hospital as "death hospital"  Security in room, asking visitor (SO) to leave.  SO has been cooperative and advised of policies as well.

## 2020-01-05 NOTE — ED Triage Notes (Signed)
Patient presents to Emergency Department via [facility] from home with complaints of nonverbal and hyperventilation. Per EMS pt was taking citalopram and started Escitalopram on the 15th of this month,  Boyfriend reports some ETOH use last night. Pt had c/o diarrhea and being cold and sweaty prior.  Pt won't answer any questions, rapid breathing

## 2020-01-06 DIAGNOSIS — F410 Panic disorder [episodic paroxysmal anxiety] without agoraphobia: Secondary | ICD-10-CM

## 2020-01-06 NOTE — ED Notes (Signed)
Report to dee, rn.  

## 2020-01-06 NOTE — ED Notes (Addendum)
Vs assessed and shower offered.

## 2020-01-06 NOTE — ED Notes (Signed)
Pt continues to pump, po fluids provided.

## 2020-01-06 NOTE — ED Notes (Signed)
Pt up to restroom.

## 2020-01-06 NOTE — ED Notes (Signed)
Pt ambulated to lobby with NT.

## 2020-01-06 NOTE — ED Notes (Signed)
Psych provider at bedside

## 2020-01-06 NOTE — ED Notes (Signed)
Breastmilk taken to Mother-Baby by [place], NT.

## 2020-01-06 NOTE — ED Notes (Signed)
Pt using hand pump to express breast milk. Privacy maintained during pumping.
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
## 2020-01-06 NOTE — ED Notes (Signed)
Pt IVC/ pending placement  

## 2020-01-06 NOTE — ED Notes (Signed)
Breast milk labeled, two bottles and sent to special care nursery at pt request.

## 2020-01-06 NOTE — ED Notes (Signed)
Pt given breakfast tray

## 2020-02-05 ENCOUNTER — Encounter: Payer: Self-pay | Admitting: Obstetrics and Gynecology

## 2020-02-05 ENCOUNTER — Other Ambulatory Visit (INDEPENDENT_AMBULATORY_CARE_PROVIDER_SITE_OTHER): Payer: Medicaid Other

## 2020-02-05 ENCOUNTER — Other Ambulatory Visit: Payer: Self-pay

## 2020-02-05 ENCOUNTER — Ambulatory Visit (INDEPENDENT_AMBULATORY_CARE_PROVIDER_SITE_OTHER): Payer: Medicaid Other | Admitting: Obstetrics and Gynecology

## 2020-02-05 VITALS — BP 126/74 | Wt 162.0 lb

## 2020-02-05 DIAGNOSIS — O8612 Endometritis following delivery: Secondary | ICD-10-CM

## 2020-02-05 MED ORDER — AMOXICILLIN-POT CLAVULANATE 875-125 MG PO TABS: 1.0000 | ORAL_TABLET | Freq: Two times a day (BID) | ORAL | 0 refills | Status: DC
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
## 2020-02-05 NOTE — Addendum Note (Signed)
Addended by: Thomasene Mohair D on: 02/05/2020 04:40 PM   Modules accepted: Orders

## 2020-02-07 ENCOUNTER — Telehealth: Payer: Self-pay | Admitting: Obstetrics & Gynecology

## 2020-02-07 NOTE — Telephone Encounter (Signed)
Patient returned my call to schedule Suction D&C - was going to be with Jean Rosenthal on 1/27 but she would like to have it done on 1/25 with [facility]. SDJ adv that he had spoken with Tiburcio Pea regarding this case.  DOS 1/25 @ 2:00 OR rm 3  H&P 1/24 @ 3:30 w Tiburcio Pea   Covid testing 1/24 @ 8-10:30, [address], drive up and wear mask. Advised pt to quarantine until DOS.  Pre-admit phone call appointment requested for 1/24.  Advised that pt may also receive calls from the hospital pharmacy and pre-service center.  Confirmed pt has [employer] as primary insurance. No secondary insurance.  ________  NOTES: When pt called me back I inquired as to how she was feeling. She said that she is no longer running a fever however she said that her bleeding is much more now and is bright red in color. I did adv SDJ of this and he feels better about her having procedure done earlier. He also adv pt to go to ER should sx worsen over the weekend.

## 2020-02-07 NOTE — Telephone Encounter (Signed)
-----   Message from Conard Novak, MD sent at 02/05/2020  4:22 PM EST ----- Regarding: Schedule surgery Surgery Booking Request Patient Full Name:  Lindsay Joseph  MRN: [id number]  DOB: 1985/10/18  Surgeon: Thomasene Mohair, MD or first available provider Requested Surgery Date and Time: TBD (next week at latest) Primary Diagnosis AND Code:  1) Postpartum endometritis [O86.12] 2) Retained products of conception after delivery without hemorrhage [O73.1]  Secondary Diagnosis and Code:  Surgical Procedure: Suction D&C RNFA Requested?: No L&D Notification: No Admission Status: same day surgery Length of Surgery: 50 min Special Case Needs: No H&P: Yes Phone Interview???:  Yes Interpreter: No Medical Clearance:  No Special Scheduling Instructions: Yes. No later than 1/27 with any available provider Any known health/anesthesia issues, diabetes, sleep apnea, latex allergy, defibrillator/pacemaker?: No Acuity: P2   (P1 highest, P2 delay may cause harm, P3 low, elective gyn, P4 lowest)

## 2020-02-07 NOTE — Telephone Encounter (Signed)
Called pt to adv that her procedure has been moved to 02/11/20 as requested. Dr Tiburcio Pea will be the provider. I also moved her appt on 1/24 to 3:30 w [facility].   I adv that Covid testing is 1/24 at 8-10:30 and they ask that she quar until surgery. I adv that she should wear a mask after testing to prevent exposure.   I adv pt to arr at [facility] on 1/25 at 11:30. PAT requested that she arr 2 hours prior to her scheduled surgery time.  This was left on her voice mail per her request.

## 2020-02-10 ENCOUNTER — Other Ambulatory Visit: Payer: Self-pay | Admitting: Obstetrics & Gynecology

## 2020-02-10 ENCOUNTER — Other Ambulatory Visit
Admission: RE | Admit: 2020-02-10 | Discharge: 2020-02-10 | Disposition: A | Discharge status: Home / Self Care | Payer: Medicaid Other | Source: Ambulatory Visit | Attending: Obstetrics and Gynecology | Admitting: Obstetrics and Gynecology

## 2020-02-10 ENCOUNTER — Ambulatory Visit (INDEPENDENT_AMBULATORY_CARE_PROVIDER_SITE_OTHER): Payer: Medicaid Other | Admitting: Obstetrics & Gynecology

## 2020-02-10 ENCOUNTER — Ambulatory Visit: Payer: Medicaid Other | Admitting: Obstetrics and Gynecology

## 2020-02-10 ENCOUNTER — Other Ambulatory Visit: Payer: Self-pay

## 2020-02-10 VITALS — BP 100/62 | HR 68 | Ht 64.0 in | Wt 158.0 lb

## 2020-02-10 DIAGNOSIS — U071 COVID-19: Secondary | ICD-10-CM | POA: Diagnosis not present

## 2020-02-10 DIAGNOSIS — Z01812 Encounter for preprocedural laboratory examination: Secondary | ICD-10-CM | POA: Diagnosis present

## 2020-02-10 DIAGNOSIS — O8612 Endometritis following delivery: Secondary | ICD-10-CM

## 2020-02-10 LAB — SARS CORONAVIRUS 2 (TAT 6-24 HRS): SARS Coronavirus 2: POSITIVE — AB

## 2020-02-10 NOTE — Patient Instructions (Signed)
Dilation and Curettage or Vacuum Curettage, Care After  This sheet gives you information about how to care for yourself after your procedure. Your health care provider may also give you more specific instructions. If you have problems or questions, contact your health care provider.  What can I expect after the procedure?  After the procedure, it is common to have:  · Mild pain or cramping.  · Some vaginal bleeding or spotting.  These may last for up to 2 weeks after your procedure.  Follow these instructions at home:  Medicines  · Take over-the-counter and prescription medicines only as told by your health care provider. This is especially important if you take blood-thinning medicine.  · Ask your health care provider if the medicine prescribed to you requires you to avoid driving or using machinery.  Activity  · If you were given a sedative during the procedure, it can affect you for several hours. Do not drive or operate machinery until your health care provider says that it is safe.  · Rest as told by your health care provider.  · Avoid sitting for a long time without moving. Get up to take short walks every 1-2 hours. This is important to improve blood flow and breathing. Ask for help if you feel weak or unsteady.  · Do not lift anything that is heavier than 10 lb (4.5 kg), or the limit that you are told, until your health care provider says that it is safe.  · Return to your normal activities as told by your health care provider. Ask your health care provider what activities are safe for you.  Lifestyle  For at least 2 weeks, or as long as told by your health care provider, do not:  · Douche.  · Use tampons.  · Have sex.  General instructions  · Wear compression stockings as told by your health care provider. These stockings help to prevent blood clots and reduce swelling in your legs.  · It is up to you to get the results of your procedure. Ask your health care provider, or the department that is doing the  procedure, when your results will be ready.  · Keep all follow-up visits as told by your health care provider. This is important.  Contact a health care provider if:  · You have severe cramps that get worse or that do not get better with medicine.  · You have severe pain in the abdomen.  · You cannot drink fluids without vomiting.  · You develop pain in a different area of your pelvis.  · You have bad-smelling discharge from the vagina.  · You have a rash.  Get help right away if:  · You have vaginal bleeding that soaks more than one sanitary pad in 1 hour for 2 hours in a row, or you pass large clots from your vagina.  · You have a fever that is above 100.4°F (38.0°C).  · Your abdomen feels very tender or hard.  · You have chest pain.  · You have shortness of breath.  · You feel dizzy or light-headed, or you faint.  · You have pain in your neck or shoulder area.  These symptoms may represent a serious problem that is an emergency. Do not wait to see if the symptoms will go away. Get medical help right away. Call your local emergency services (911 in the [place] Do not drive yourself to the hospital.  Summary  · After your procedure, it is common   to have mild pain and bleeding or spotting that may last for up to 2 weeks.  · Rest as told. Avoid sitting for a long time without moving. Get up to take short walks every 1-2 hours.  · Do not lift anything that is heavier than 10 lb (4.5 kg), or the limit that you are told.  · Monitor yourself for any complications that may develop after the procedure.  · Keep all follow-up visits as told by your health care provider. Know the symptoms for which you should get help right away.  This information is not intended to replace advice given to you by your health care provider. Make sure you discuss any questions you have with your health care provider.  Document Revised: 02/05/2019 Document Reviewed: 02/05/2019  Elsevier Patient Education © 2021 [employer] Inc[employer]
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
## 2020-02-11 ENCOUNTER — Other Ambulatory Visit: Payer: Self-pay

## 2020-02-11 ENCOUNTER — Encounter: Payer: Self-pay | Admitting: Obstetrics & Gynecology

## 2020-02-11 ENCOUNTER — Ambulatory Visit: Payer: Medicaid Other | Admitting: Anesthesiology

## 2020-02-11 ENCOUNTER — Other Ambulatory Visit: Payer: Medicaid Other

## 2020-02-11 ENCOUNTER — Ambulatory Visit
Admission: RE | Admit: 2020-02-11 | Discharge: 2020-02-11 | Disposition: A | Discharge status: Home / Self Care | Payer: Medicaid Other | Attending: Obstetrics & Gynecology | Admitting: Obstetrics & Gynecology

## 2020-02-11 ENCOUNTER — Encounter
Admission: RE | Disposition: A | Discharge status: Home / Self Care | Payer: Self-pay | Source: Home / Self Care | Attending: Obstetrics & Gynecology

## 2020-02-11 DIAGNOSIS — O8612 Endometritis following delivery: Secondary | ICD-10-CM | POA: Insufficient documentation

## 2020-02-11 DIAGNOSIS — U071 COVID-19: Secondary | ICD-10-CM | POA: Diagnosis not present

## 2020-02-11 DIAGNOSIS — N939 Abnormal uterine and vaginal bleeding, unspecified: Secondary | ICD-10-CM

## 2020-02-11 DIAGNOSIS — O9853 Other viral diseases complicating the puerperium: Secondary | ICD-10-CM | POA: Diagnosis not present

## 2020-02-11 DIAGNOSIS — Z79899 Other long term (current) drug therapy: Secondary | ICD-10-CM | POA: Insufficient documentation

## 2020-02-11 DIAGNOSIS — O8689 Other specified puerperal infections: Secondary | ICD-10-CM | POA: Insufficient documentation

## 2020-02-11 HISTORY — PX: DILATION AND EVACUATION: SHX1459

## 2020-02-11 LAB — CBC
HCT: 37.9 % (ref 36.0–46.0)
Hemoglobin: 12.1 g/dL (ref 12.0–15.0)
MCH: 29.1 pg (ref 26.0–34.0)
MCHC: 31.9 g/dL (ref 30.0–36.0)
MCV: 91.1 fL (ref 80.0–100.0)
Platelets: 230 10*3/uL (ref 150–400)
RBC: 4.16 MIL/uL (ref 3.87–5.11)
RDW: 12.5 % (ref 11.5–15.5)
WBC: 6.5 10*3/uL (ref 4.0–10.5)
nRBC: 0 % (ref 0.0–0.2)

## 2020-02-11 LAB — POCT PREGNANCY, URINE: Preg Test, Ur: NEGATIVE

## 2020-02-11 LAB — TYPE AND SCREEN
ABO/RH(D): A POS
Antibody Screen: NEGATIVE

## 2020-02-11 SURGERY — DILATION AND EVACUATION, UTERUS
Anesthesia: General

## 2020-02-11 MED ORDER — LACTATED RINGERS IV SOLN: INTRAVENOUS | Status: DC

## 2020-02-11 MED ORDER — MORPHINE SULFATE (PF) 2 MG/ML IV SOLN
1.0000 mg | INTRAVENOUS | Status: DC | PRN
Start: 2020-02-11 — End: 2020-02-13

## 2020-02-11 MED ORDER — FAMOTIDINE 20 MG PO TABS
ORAL_TABLET | ORAL | Status: AC
  Filled 2020-02-11: qty 1

## 2020-02-11 MED ORDER — METHYLERGONOVINE MALEATE 0.2 MG PO TABS
0.2000 mg | ORAL_TABLET | Freq: Four times a day (QID) | ORAL | Status: DC
  Filled 2020-02-11: qty 1

## 2020-02-11 MED ORDER — CHLORHEXIDINE GLUCONATE 0.12 % MT SOLN
OROMUCOSAL | Status: AC
  Filled 2020-02-11: qty 15

## 2020-02-11 MED ORDER — IBUPROFEN 400 MG PO TABS
400.0000 mg | ORAL_TABLET | Freq: Four times a day (QID) | ORAL | Status: DC | PRN
  Filled 2020-02-11: qty 1

## 2020-02-11 MED ORDER — DOXYCYCLINE HYCLATE 100 MG PO TABS
100.0000 mg | ORAL_TABLET | Freq: Two times a day (BID) | ORAL | Status: DC
  Filled 2020-02-11: qty 1

## 2020-02-11 MED ORDER — CHLORHEXIDINE GLUCONATE 0.12 % MT SOLN
15.0000 mL | Freq: Once | OROMUCOSAL | Status: AC
  Administered 2020-02-11: 15 mL via OROMUCOSAL

## 2020-02-11 MED ORDER — DOXYCYCLINE HYCLATE 100 MG PO TABS: 100.0000 mg | ORAL_TABLET | Freq: Two times a day (BID) | ORAL | 0 refills | Status: DC

## 2020-02-11 MED ORDER — ORAL CARE MOUTH RINSE: 15.0000 mL | Freq: Once | OROMUCOSAL | Status: AC

## 2020-02-11 MED ORDER — PROPOFOL 10 MG/ML IV BOLUS
INTRAVENOUS | Status: DC | PRN
  Administered 2020-02-11: 50 mg via INTRAVENOUS

## 2020-02-11 MED ORDER — FAMOTIDINE 20 MG PO TABS
20.0000 mg | ORAL_TABLET | Freq: Once | ORAL | Status: AC
  Administered 2020-02-11: 20 mg via ORAL

## 2020-02-11 MED ORDER — ACETAMINOPHEN 325 MG PO TABS: 650.0000 mg | ORAL_TABLET | ORAL | Status: DC | PRN

## 2020-02-11 MED ORDER — PROPOFOL 500 MG/50ML IV EMUL
INTRAVENOUS | Status: DC | PRN
  Administered 2020-02-11: 100 ug/kg/min via INTRAVENOUS

## 2020-02-11 MED ORDER — POVIDONE-IODINE 10 % EX SWAB
2.0000 "application " | Freq: Once | CUTANEOUS | Status: AC
  Administered 2020-02-11: 2 via TOPICAL

## 2020-02-11 MED ORDER — PROPOFOL 10 MG/ML IV BOLUS
INTRAVENOUS | Status: AC
  Filled 2020-02-11: qty 20

## 2020-02-11 MED ORDER — CEFAZOLIN (ANCEF) 1 G IV SOLR: 1.0000 g | INTRAVENOUS | Status: AC

## 2020-02-11 MED ORDER — ONDANSETRON HCL 4 MG/2ML IJ SOLN
INTRAMUSCULAR | Status: DC | PRN
  Administered 2020-02-11: 4 mg via INTRAVENOUS

## 2020-02-11 MED ORDER — MIDAZOLAM HCL 2 MG/2ML IJ SOLN
INTRAMUSCULAR | Status: AC
  Filled 2020-02-11: qty 2

## 2020-02-11 MED ORDER — METHYLERGONOVINE MALEATE 0.2 MG/ML IJ SOLN
INTRAMUSCULAR | Status: DC | PRN
  Administered 2020-02-11: .2 mg via INTRAMUSCULAR

## 2020-02-11 MED ORDER — IBUPROFEN 400 MG PO TABS: 400.0000 mg | ORAL_TABLET | Freq: Four times a day (QID) | ORAL | 0 refills | Status: DC | PRN

## 2020-02-11 MED ORDER — MIDAZOLAM HCL 2 MG/2ML IJ SOLN
INTRAMUSCULAR | Status: DC | PRN
  Administered 2020-02-11: 2 mg via INTRAVENOUS

## 2020-02-11 MED ORDER — METHYLERGONOVINE MALEATE 0.2 MG PO TABS: 0.2000 mg | ORAL_TABLET | Freq: Four times a day (QID) | ORAL | 0 refills | Status: DC

## 2020-02-11 MED ORDER — FENTANYL CITRATE (PF) 100 MCG/2ML IJ SOLN
INTRAMUSCULAR | Status: DC | PRN
  Administered 2020-02-11 (×2): 50 ug via INTRAVENOUS

## 2020-02-11 MED ORDER — FENTANYL CITRATE (PF) 100 MCG/2ML IJ SOLN
INTRAMUSCULAR | Status: AC
  Filled 2020-02-11: qty 2

## 2020-02-11 MED ORDER — DEXAMETHASONE SODIUM PHOSPHATE 10 MG/ML IJ SOLN
INTRAMUSCULAR | Status: DC | PRN
  Administered 2020-02-11: 10 mg via INTRAVENOUS

## 2020-02-11 MED ORDER — ACETAMINOPHEN 650 MG RE SUPP
650.0000 mg | RECTAL | Status: DC | PRN
  Filled 2020-02-11: qty 1

## 2020-02-11 SURGICAL SUPPLY — 17 items
FILTER UTR ASPR SPEC (MISCELLANEOUS) ×1 IMPLANT
FLTR UTR ASPR SPEC (MISCELLANEOUS) ×2
GLOVE BIO SURGEON STRL SZ8 (GLOVE) ×4 IMPLANT
GOWN STRL REUS W/ TWL LRG LVL3 (GOWN DISPOSABLE) ×1 IMPLANT
GOWN STRL REUS W/ TWL XL LVL3 (GOWN DISPOSABLE) ×1 IMPLANT
GOWN STRL REUS W/TWL LRG LVL3 (GOWN DISPOSABLE) ×2
GOWN STRL REUS W/TWL XL LVL3 (GOWN DISPOSABLE) ×2
KIT BERKELEY 1ST TRIMESTER 3/8 (MISCELLANEOUS) ×2 IMPLANT
KIT TURNOVER CYSTO (KITS) ×2 IMPLANT
NS IRRIG 500ML POUR BTL (IV SOLUTION) ×2 IMPLANT
PACK DNC HYST (MISCELLANEOUS) ×2 IMPLANT
PAD OB MATERNITY 4.3X12.25 (PERSONAL CARE ITEMS) ×2 IMPLANT
PAD PREP 24X41 OB/GYN DISP (PERSONAL CARE ITEMS) ×2 IMPLANT
SET BERKELEY SUCTION TUBING (SUCTIONS) ×2 IMPLANT
VACURETTE 10 RIGID CVD (CANNULA) IMPLANT
VACURETTE 12 RIGID CVD (CANNULA) IMPLANT
VACURETTE 8 RIGID CVD (CANNULA) ×2 IMPLANT

## 2020-02-11 NOTE — Interval H&P Note (Signed)
History and Physical Interval Note:  02/11/2020 12:12 PM  Lindsay Joseph  has presented today for surgery, with the diagnosis of Postpartum endometritis O86.12 Retained products of conception after delivery without hemorrhage O73.1.  The various methods of treatment have been discussed with the patient and family. After consideration of risks, benefits and other options for treatment, the patient has consented to  Procedure(s): DILATATION AND EVACUATION (N/A) as a surgical intervention.  The patient's history has been reviewed, patient examined, no change in status, stable for surgery.  I have reviewed the patient's chart and labs.  Questions were answered to the patient's satisfaction.     Letitia Libra

## 2020-02-11 NOTE — Op Note (Signed)
  Operative Note  02/11/2020 2:42 PM  PRE-OP DIAGNOSIS: Postpartum endometritis O86.12 Retained products of conception after delivery without hemorrhage O73.1   POST-OP DIAGNOSIS: same  SURGEON: Annamarie Major, MD, FACOG  ANESTHESIA: Choice   PROCEDURE: Procedure(s): DILATATION AND EVACUATION   ESTIMATED BLOOD LOSS: 20 mL   SPECIMENS: POC   COMPLICATIONS: none  DISPOSITION: PACU - hemodynamically stable.  CONDITION: stable  FINDINGS: Exam under anesthesia revealed a smal uterus without palpable adnexal masses.   INDICATION FOR PROCEDURE: Bleeding and infection 6 weeks post partum from early pre-viable delivery  PROCEDURE IN DETAIL: After informed consent was obtained, the patient was taken to the operating room where anesthesia was obtained without difficulty. The patient was positioned in the dorsal lithotomy position with [employer]. Time out was performed and an exam under anesthesia was performed. The vagina, perineum, and lower abdomen were prepped and draped in a normal sterile fashion. The bladder was emptied with an I&O catheter. A speculum was placed into the vagina and the cervix was grasped with a single toothed tenaculum. The uterus was sounded to 10cm.  The cervix was gently dilated to 20 [place] with  [employer] dilators. The suction was then tested and found to be adequate, and a 82mm rigid suction cannula was advanced into the uterine cavity. The suction was activated and the contents of the uterus were aspirated until no further tissue was obtained. The uterus was then curetted to gritty texture throughout.  At the end of the procedure bleeding was noted to beMinimal.  All instruments were then removed from the vagina.The patient tolerated the procedure well. All sponge, instrument, and needle counts were correct. The patient was taken to the recovery room in good condition.   Annamarie Major, MD, [facility], [facility] 02/11/2020  2:42  PM

## 2020-02-11 NOTE — Discharge Instructions (Signed)
Dilation and Curettage or Vacuum Curettage, Care After  This sheet gives you information about how to care for yourself after your procedure. Your health care provider may also give you more specific instructions. If you have problems or questions, contact your health care provider.  What can I expect after the procedure?  After the procedure, it is common to have:  · Mild pain or cramping.  · Some vaginal bleeding or spotting.  These may last for up to 2 weeks after your procedure.  Follow these instructions at home:  Medicines  · Take over-the-counter and prescription medicines only as told by your health care provider. This is especially important if you take blood-thinning medicine.  · Ask your health care provider if the medicine prescribed to you requires you to avoid driving or using machinery.  Activity  · If you were given a sedative during the procedure, it can affect you for several hours. Do not drive or operate machinery until your health care provider says that it is safe.  · Rest as told by your health care provider.  · Avoid sitting for a long time without moving. Get up to take short walks every 1-2 hours. This is important to improve blood flow and breathing. Ask for help if you feel weak or unsteady.  · Do not lift anything that is heavier than 10 lb (4.5 kg), or the limit that you are told, until your health care provider says that it is safe.  · Return to your normal activities as told by your health care provider. Ask your health care provider what activities are safe for you.  Lifestyle  For at least 2 weeks, or as long as told by your health care provider, do not:  · Douche.  · Use tampons.  · Have sex.  General instructions  · Wear compression stockings as told by your health care provider. These stockings help to prevent blood clots and reduce swelling in your legs.  · It is up to you to get the results of your procedure. Ask your health care provider, or the department that is doing the  procedure, when your results will be ready.  · Keep all follow-up visits as told by your health care provider. This is important.  Contact a health care provider if:  · You have severe cramps that get worse or that do not get better with medicine.  · You have severe pain in the abdomen.  · You cannot drink fluids without vomiting.  · You develop pain in a different area of your pelvis.  · You have bad-smelling discharge from the vagina.  · You have a rash.  Get help right away if:  · You have vaginal bleeding that soaks more than one sanitary pad in 1 hour for 2 hours in a row, or you pass large clots from your vagina.  · You have a fever that is above 100.4°F (38.0°C).  · Your abdomen feels very tender or hard.  · You have chest pain.  · You have shortness of breath.  · You feel dizzy or light-headed, or you faint.  · You have pain in your neck or shoulder area.  These symptoms may represent a serious problem that is an emergency. Do not wait to see if the symptoms will go away. Get medical help right away. Call your local emergency services (911 in the [place] Do not drive yourself to the hospital.  Summary  · After your procedure, it is common   to have mild pain and bleeding or spotting that may last for up to 2 weeks.  · Rest as told. Avoid sitting for a long time without moving. Get up to take short walks every 1-2 hours.  · Do not lift anything that is heavier than 10 lb (4.5 kg), or the limit that you are told.  · Monitor yourself for any complications that may develop after the procedure.  · Keep all follow-up visits as told by your health care provider. Know the symptoms for which you should get help right away.  This information is not intended to replace advice given to you by your health care provider. Make sure you discuss any questions you have with your health care provider.  Document Revised: 02/05/2019 Document Reviewed: 02/05/2019  Elsevier Patient Education © 2021 Elsevier Inc.
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
## 2020-02-11 NOTE — Transfer of Care (Signed)
Immediate Anesthesia Transfer of Care Note  Patient: Lindsay Joseph  Procedure(s) Performed: DILATATION AND EVACUATION (N/A )  Patient Location: PACU  Anesthesia Type:General  Level of Consciousness: sedated  Airway & Oxygen Therapy: Patient Spontanous Breathing and Patient connected to nasal cannula oxygen  Post-op Assessment: Report given to RN and Post -op Vital signs reviewed and stable  Post vital signs: Reviewed and stable  Last Vitals:  Vitals Value Taken Time  BP    Temp    Pulse    Resp    SpO2      Last Pain:  Vitals:   02/11/20 1205  TempSrc: Oral  PainSc: 0-No pain         Complications: No complications documented.

## 2020-02-12 ENCOUNTER — Other Ambulatory Visit: Payer: Medicaid Other

## 2020-02-12 ENCOUNTER — Encounter: Payer: Self-pay | Admitting: Obstetrics & Gynecology

## 2020-02-13 LAB — SURGICAL PATHOLOGY

## 2020-02-13 NOTE — Anesthesia Postprocedure Evaluation (Signed)
Anesthesia Post Note  Patient: Lindsay Joseph  Procedure(s) Performed: DILATATION AND EVACUATION (N/A )  Patient location during evaluation: PACU Anesthesia Type: General Level of consciousness: awake and alert Pain management: pain level controlled Vital Signs Assessment: post-procedure vital signs reviewed and stable Respiratory status: spontaneous breathing, nonlabored ventilation and respiratory function stable Cardiovascular status: blood pressure returned to baseline and stable Postop Assessment: no apparent nausea or vomiting Anesthetic complications: no   No complications documented.   Last Vitals:  Vitals:   02/11/20 1432 02/11/20 1433  BP:  (!) 104/56  Pulse: (!) 56 (!) 58  Resp: 10 (!) 9  Temp:    SpO2: 100% 100%    Last Pain:  Vitals:   02/12/20 0831  TempSrc:   PainSc: 0-No pain                 Christia Reading

## 2020-02-18 ENCOUNTER — Other Ambulatory Visit: Payer: Self-pay

## 2020-02-18 ENCOUNTER — Ambulatory Visit (INDEPENDENT_AMBULATORY_CARE_PROVIDER_SITE_OTHER): Payer: Medicaid Other | Admitting: Obstetrics and Gynecology

## 2020-02-18 VITALS — BP 126/80

## 2020-02-18 DIAGNOSIS — Z09 Encounter for follow-up examination after completed treatment for conditions other than malignant neoplasm: Secondary | ICD-10-CM

## 2020-02-18 DIAGNOSIS — B3731 Acute candidiasis of vulva and vagina: Secondary | ICD-10-CM

## 2020-02-18 DIAGNOSIS — B373 Candidiasis of vulva and vagina: Secondary | ICD-10-CM

## 2020-02-18 DIAGNOSIS — N99820 Postprocedural hemorrhage and hematoma of a genitourinary system organ or structure following a genitourinary system procedure: Secondary | ICD-10-CM

## 2020-02-18 MED ORDER — FLUCONAZOLE 150 MG PO TABS: 150.0000 mg | ORAL_TABLET | Freq: Once | ORAL | 0 refills | Status: AC

## 2020-02-18 NOTE — Progress Notes (Signed)
   Postoperative Follow-up Patient presents post op from dilation and evacuation  1 week ago for retained products of conception.  Subjective: Patient reports some improvement in her preop symptoms. Eating a regular diet without difficulty.  She had no bleeding post-op until post-op day #4 where she had heavy bright red bleeding, which slowed significantly and then stopped the next day.  She notes still some left lower quadrant pain.  She has been taking Augmentin since her last appointment with me nearly two weeks ago.  She was also given doxycycline, which she has now completed.  She has had diarrhea since she lost her pregnancy in December.  She denies hematochezia and melena. She denies fevers and chills. She is tolerating a regular diet.   Objective: Vitals:   02/18/20 1519  BP: 126/80   Vital Signs: BP 126/80  Constitutional: Well nourished, well developed female in no acute distress.  HEENT: normal Skin: Warm and dry.  Extremity: no edema  Abdomen: s/mild ttp in llq/no masses, no guarding or rebound.   Pelvic exam: Deferred    Assessment: 35 y.o. s/p dilation and evacuation overall doing well.  She has some left lower quadrant abdominal tenderness, which, given her antibiotic using since surgery (Augmentin and Doxycyclin) is very unlikely to be an infection.     Plan: She was given strict instructions should she develop fevers, chills, worsening abdominal pain, or heavy vaginal bleeding (soaking > 1 pad per hour for more than 2 hours) to return to the office or the ER, depending on availability.  All questions answered.    Activity plan: resume slowly.    Consider birth control options. Call with questions.   Thomasene Mohair, MD 02/18/2020, 3:35 PM

## 2020-02-19 ENCOUNTER — Encounter: Payer: Self-pay | Admitting: Obstetrics and Gynecology

## 2020-04-29 ENCOUNTER — Encounter: Payer: Self-pay | Admitting: Advanced Practice Midwife

## 2020-04-29 ENCOUNTER — Ambulatory Visit (INDEPENDENT_AMBULATORY_CARE_PROVIDER_SITE_OTHER): Payer: Medicaid Other | Admitting: Advanced Practice Midwife

## 2020-04-29 ENCOUNTER — Other Ambulatory Visit: Payer: Self-pay

## 2020-04-29 VITALS — BP 122/70 | Ht 62.0 in | Wt 163.6 lb

## 2020-04-29 DIAGNOSIS — N941 Unspecified dyspareunia: Secondary | ICD-10-CM

## 2020-04-29 MED ORDER — ESTROGENS CONJUGATED 0.3 MG PO TABS: 0.3000 mg | ORAL_TABLET | Freq: Every day | ORAL | 11 refills | Status: DC

## 2020-04-29 NOTE — Patient Instructions (Signed)
Dyspareunia, Female Dyspareunia is pain that is associated with sexual activity. This can affect any part of the genitals or lower abdomen. There are many possible causes of this condition. In some cases, diagnosing the cause of dyspareunia can be difficult. This condition can be mild, moderate, or severe. Depending on the cause, dyspareunia may get better with treatment, but may return (recur) over time. What are the causes? The cause of this condition is not always known. However, problems that affect the vulva, vagina, uterus, and other organs may cause dyspareunia. Common causes of this condition include:  Severe pain and tenderness of the vulva when it is touched (vulvodynia).  Vaginal dryness.  Giving birth.  Infection.  Skin changes or conditions.  Side effects of medicines.  Endometriosis. This is when tissue that is like the lining of the uterus grows on the outside of the uterus.  Psychological conditions. These include depression, anxiety, or traumatic experiences.  Allergic reaction.   What increases the risk? The following factors may make you more likely to develop this condition:  History of physical or sexual trauma.  Some medicines.  No longer having a monthly period (menopause).  Having recently given birth.  Taking baths using soaps that have perfumes. These can cause irritation.  Douching. What are the signs or symptoms? The main symptom of this condition is pain in any part of your genitals or lower abdomen during or after sex. This may include:  Irritation, burning, or stinging sensations in your vulva.  Discomfort when your vulva or surrounding area is touched.  Aching and throbbing pain that may be constant.  Pain that gets worse when something is inserted into your vagina. How is this diagnosed? This condition may be diagnosed based on:  Your symptoms, including where and when your pain occurs.  Your medical history.  A physical exam. A  pelvic exam will most likely be done.  Tests that include ultrasound, blood tests, and tests that check the body for infection.  Imaging tests, such as X-ray, MRI, and CT scan. You may be referred to a health care provider who specializes in women's health (gynecologist). How is this treated? Treatment depends on the cause of your condition and your symptoms. In most cases, you may need to stop sexual activity until your symptoms go away or get better. Treatment may include:  Lubricants, ointments, and creams.  Physical therapy.  Massage therapy.  Hormonal therapy.  Medicines to: ? Prevent or fight infection. ? Relieve pain. ? Help numb the area. ? Treat depression (antidepressants).  Counseling, which may include sex therapy.  Surgery. Follow these instructions at home: Lifestyle  Wear cotton underwear.  Use water-based lubricants as needed during sex. Avoid oil-based lubricants.  Do not use any products that can cause irritation. This may include certain condoms, spermicides, lubricants, soaps, tampons, vaginal sprays, or douches.  Always practice safe sex. Use a condom to prevent sexually transmitted infections (STIs).  Talk freely with your partner about your condition. General instructions  Take or apply over-the-counter and prescription medicines only as told by your health care provider.  Urinate before you have sex.  Consider joining a support group.  Get the results of any tests you have done. Ask your health care provider, or the department that is doing the procedure, when your results will be ready.  Keep all follow-up visits as told by your health care provider. This is important. Contact a health care provider if:  You have vaginal bleeding after having sex.    You develop a lump at the opening of your vagina even if the lump is painless.  You have: ? Abnormal discharge from your vagina. ? Vaginal dryness. ? Itchiness or irritation of your vulva  or vagina. ? A new rash. ? Symptoms that get worse or do not improve with treatment. ? A fever. ? Pain when you urinate. ? Blood in your urine. Get help right away if:  You have severe pain in your abdomen during or shortly after sex.  You pass out after sex. Summary  Dyspareunia is pain that is associated with sexual activity. This can affect any part of the genitals or lower abdomen.  There are many causes of this condition. Treatment depends on the cause and your symptoms. In most cases, you may need to stop sexual activity until your symptoms improve.  Take or apply over-the-counter and prescription medicines only as told by your health care provider.  Contact a health care provider if your symptoms get worse or do not improve with treatment.  Keep all follow-up visits as told by your health care provider. This is important. This information is not intended to replace advice given to you by your health care provider. Make sure you discuss any questions you have with your health care provider. Document Revised: 02/14/2019 Document Reviewed: 03/12/2018 Elsevier Patient Education  2021 Elsevier Inc.  
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
## 2020-04-30 LAB — ESTRADIOL: Estradiol: 200 pg/mL

## 2020-04-30 LAB — PROGESTERONE: Progesterone: 0.5 ng/mL

## 2020-05-02 ENCOUNTER — Emergency Department
Admission: EM | Admit: 2020-05-02 | Discharge: 2020-05-02 | Disposition: A | Discharge status: Home / Self Care | Payer: Medicaid Other | Attending: Emergency Medicine | Admitting: Emergency Medicine

## 2020-05-02 ENCOUNTER — Other Ambulatory Visit: Payer: Self-pay

## 2020-05-02 ENCOUNTER — Emergency Department: Payer: Medicaid Other

## 2020-05-02 DIAGNOSIS — R102 Pelvic and perineal pain: Secondary | ICD-10-CM | POA: Diagnosis present

## 2020-05-02 DIAGNOSIS — R770 Abnormality of albumin: Secondary | ICD-10-CM | POA: Diagnosis not present

## 2020-05-02 DIAGNOSIS — N730 Acute parametritis and pelvic cellulitis: Secondary | ICD-10-CM

## 2020-05-02 DIAGNOSIS — R1030 Lower abdominal pain, unspecified: Secondary | ICD-10-CM

## 2020-05-02 DIAGNOSIS — N739 Female pelvic inflammatory disease, unspecified: Secondary | ICD-10-CM | POA: Diagnosis not present

## 2020-05-02 LAB — COMPREHENSIVE METABOLIC PANEL
ALT: 11 U/L (ref 0–44)
AST: 13 U/L — ABNORMAL LOW (ref 15–41)
Albumin: 2.7 g/dL — ABNORMAL LOW (ref 3.5–5.0)
Alkaline Phosphatase: 32 U/L — ABNORMAL LOW (ref 38–126)
Anion gap: 4 — ABNORMAL LOW (ref 5–15)
BUN: 8 mg/dL (ref 6–20)
CO2: 23 mmol/L (ref 22–32)
Calcium: 7.7 mg/dL — ABNORMAL LOW (ref 8.9–10.3)
Chloride: 112 mmol/L — ABNORMAL HIGH (ref 98–111)
Creatinine, Ser: 0.66 mg/dL (ref 0.44–1.00)
GFR, Estimated: >60 mL/min (ref >60)
Glucose, Bld: 97 mg/dL (ref 70–99)
Potassium: 3.6 mmol/L (ref 3.5–5.1)
Sodium: 139 mmol/L (ref 135–145)
Total Bilirubin: 0.8 mg/dL (ref 0.3–1.2)
Total Protein: 4.9 g/dL — ABNORMAL LOW (ref 6.5–8.1)

## 2020-05-02 LAB — CBC WITH DIFFERENTIAL/PLATELET
Abs Immature Granulocytes: 0.02 10*3/uL (ref 0.00–0.07)
Basophils Absolute: 0 10*3/uL (ref 0.0–0.1)
Basophils Relative: 0 %
Eosinophils Absolute: 0 10*3/uL (ref 0.0–0.5)
Eosinophils Relative: 1 %
HCT: 40.9 % (ref 36.0–46.0)
Hemoglobin: 13.1 g/dL (ref 12.0–15.0)
Immature Granulocytes: 0 %
Lymphocytes Relative: 25 %
Lymphs Abs: 1.8 10*3/uL (ref 0.7–4.0)
MCH: 28.1 pg (ref 26.0–34.0)
MCHC: 32 g/dL (ref 30.0–36.0)
MCV: 87.6 fL (ref 80.0–100.0)
Monocytes Absolute: 0.5 10*3/uL (ref 0.1–1.0)
Monocytes Relative: 7 %
Neutro Abs: 4.9 10*3/uL (ref 1.7–7.7)
Neutrophils Relative %: 67 %
Platelets: 236 10*3/uL (ref 150–400)
RBC: 4.67 MIL/uL (ref 3.87–5.11)
RDW: 12.4 % (ref 11.5–15.5)
WBC: 7.3 10*3/uL (ref 4.0–10.5)
nRBC: 0 % (ref 0.0–0.2)

## 2020-05-02 LAB — WET PREP, GENITAL
Clue Cells Wet Prep HPF POC: NONE SEEN
Sperm: NONE SEEN
Trich, Wet Prep: NONE SEEN
Yeast Wet Prep HPF POC: NONE SEEN

## 2020-05-02 LAB — URINALYSIS, COMPLETE (UACMP) WITH MICROSCOPIC
Bacteria, UA: NONE SEEN
Bilirubin Urine: NEGATIVE
Glucose, UA: NEGATIVE mg/dL
Hgb urine dipstick: NEGATIVE
Ketones, ur: NEGATIVE mg/dL
Leukocytes,Ua: NEGATIVE
Nitrite: NEGATIVE
Protein, ur: NEGATIVE mg/dL
Specific Gravity, Urine: 1.027 (ref 1.005–1.030)
pH: 7 (ref 5.0–8.0)

## 2020-05-02 LAB — LIPASE, BLOOD: Lipase: 28 U/L (ref 11–51)

## 2020-05-02 LAB — CHLAMYDIA/NGC RT PCR (ARMC ONLY)
Chlamydia Tr: NOT DETECTED
N gonorrhoeae: NOT DETECTED

## 2020-05-02 LAB — POC URINE PREG, ED: Preg Test, Ur: NEGATIVE

## 2020-05-02 MED ORDER — METRONIDAZOLE 500 MG PO TABS: 500.0000 mg | ORAL_TABLET | Freq: Two times a day (BID) | ORAL | 0 refills | Status: AC

## 2020-05-02 MED ORDER — DICYCLOMINE HCL 10 MG PO CAPS: 10.0000 mg | ORAL_CAPSULE | Freq: Three times a day (TID) | ORAL | 0 refills | Status: DC | PRN

## 2020-05-02 MED ORDER — IOHEXOL 300 MG/ML  SOLN
100.0000 mL | Freq: Once | INTRAMUSCULAR | Status: AC | PRN
  Administered 2020-05-02: 100 mL via INTRAVENOUS

## 2020-05-02 MED ORDER — DOXYCYCLINE HYCLATE 100 MG PO CAPS: 100.0000 mg | ORAL_CAPSULE | Freq: Two times a day (BID) | ORAL | 8 refills | Status: DC

## 2020-05-02 MED ORDER — MORPHINE SULFATE (PF) 4 MG/ML IV SOLN
4.0000 mg | Freq: Once | INTRAVENOUS | Status: AC
  Administered 2020-05-02: 4 mg via INTRAVENOUS
  Filled 2020-05-02: qty 1

## 2020-05-02 MED ORDER — CEFTRIAXONE SODIUM 1 G IJ SOLR
500.0000 mg | Freq: Once | INTRAMUSCULAR | Status: AC
  Administered 2020-05-02: 500 mg via INTRAMUSCULAR
  Filled 2020-05-02: qty 10

## 2020-05-02 MED ORDER — DICYCLOMINE HCL 10 MG PO CAPS
20.0000 mg | ORAL_CAPSULE | Freq: Once | ORAL | Status: AC
  Administered 2020-05-02: 20 mg via ORAL
  Filled 2020-05-02: qty 2

## 2020-05-02 MED ORDER — SODIUM CHLORIDE 0.9 % IV SOLN
100.0000 mg | Freq: Once | INTRAVENOUS | Status: AC
  Administered 2020-05-02: 100 mg via INTRAVENOUS
  Filled 2020-05-02: qty 100

## 2020-05-02 NOTE — ED Notes (Signed)
Administered Morphine for pain and patient advised she feels this made her pain worse immediately. Will notify MD.

## 2020-05-02 NOTE — Discharge Instructions (Addendum)
Please seek medical attention for any high fevers, chest pain, shortness of breath, change in behavior, persistent vomiting, bloody stool or any other new or concerning symptoms.  

## 2020-05-02 NOTE — ED Notes (Signed)
Patient transported to Ultrasound 

## 2020-05-02 NOTE — ED Notes (Signed)
Pelvic cart to bedside 
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
## 2020-05-02 NOTE — ED Notes (Signed)
Patient requested INT removal. INT removed, catheter intact.

## 2020-05-02 NOTE — ED Provider Notes (Signed)
Patient CT scan without any obvious etiology of the pain. Did obtain an [place] which also did not elucidate the cause of the patient's discomfort. She was given antibiotics here in the emergency department for possible PID. Given negative imaging at this time I do think it is reasonable for patient to be discharged    Phineas Semen, MD 05/02/20 1913

## 2020-05-02 NOTE — ED Notes (Signed)
ED Provider at bedside. 

## 2020-05-02 NOTE — ED Triage Notes (Signed)
Pt comes pov with lower abdominal pain starting last night. Pt had a baby in December then had a D&C 6 weeks ago for retained products of conception. Pt states last night pain woke her up and is worse than when they did the D&C. Denies n/v/d.

## 2020-05-02 NOTE — ED Notes (Signed)
Patient aware of need for urine sample. Urine cup provided. Patient verbalized understanding.

## 2020-05-02 NOTE — ED Notes (Signed)
Patient verbalized understanding of discharge instructions. Patient verbalized understanding of prescriptions and follow up. Respirations even and unlabored. NADN. Ambulatory with steady gait.

## 2020-05-21 ENCOUNTER — Other Ambulatory Visit: Payer: Self-pay

## 2020-05-21 ENCOUNTER — Ambulatory Visit (INDEPENDENT_AMBULATORY_CARE_PROVIDER_SITE_OTHER): Payer: Medicaid Other | Admitting: Obstetrics and Gynecology

## 2020-05-21 ENCOUNTER — Encounter: Payer: Self-pay | Admitting: Obstetrics and Gynecology

## 2020-05-21 VITALS — BP 122/79 | HR 70 | Resp 16 | Ht 64.0 in | Wt 167.6 lb

## 2020-05-21 DIAGNOSIS — N898 Other specified noninflammatory disorders of vagina: Secondary | ICD-10-CM
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
## 2020-09-11 ENCOUNTER — Emergency Department: Payer: Medicaid Other

## 2020-09-11 ENCOUNTER — Emergency Department
Admission: EM | Admit: 2020-09-11 | Discharge: 2020-09-11 | Disposition: A | Discharge status: Home / Self Care | Payer: Medicaid Other | Attending: Emergency Medicine | Admitting: Emergency Medicine

## 2020-09-11 ENCOUNTER — Other Ambulatory Visit: Payer: Self-pay

## 2020-09-11 ENCOUNTER — Ambulatory Visit: Payer: Medicaid Other | Admitting: Obstetrics and Gynecology

## 2020-09-11 DIAGNOSIS — M791 Myalgia, unspecified site: Secondary | ICD-10-CM | POA: Insufficient documentation

## 2020-09-11 DIAGNOSIS — R5383 Other fatigue: Secondary | ICD-10-CM | POA: Diagnosis present

## 2020-09-11 DIAGNOSIS — R1032 Left lower quadrant pain: Secondary | ICD-10-CM | POA: Insufficient documentation

## 2020-09-11 DIAGNOSIS — Z8616 Personal history of COVID-19: Secondary | ICD-10-CM | POA: Diagnosis not present

## 2020-09-11 DIAGNOSIS — N12 Tubulo-interstitial nephritis, not specified as acute or chronic: Secondary | ICD-10-CM | POA: Insufficient documentation

## 2020-09-11 LAB — URINALYSIS, COMPLETE (UACMP) WITH MICROSCOPIC
Bilirubin Urine: NEGATIVE
Glucose, UA: NEGATIVE mg/dL
Ketones, ur: NEGATIVE mg/dL
Nitrite: NEGATIVE
Protein, ur: NEGATIVE mg/dL
Specific Gravity, Urine: 1.014 (ref 1.005–1.030)
pH: 5 (ref 5.0–8.0)

## 2020-09-11 LAB — WET PREP, GENITAL
Sperm: NONE SEEN
Trich, Wet Prep: NONE SEEN
Yeast Wet Prep HPF POC: NONE SEEN

## 2020-09-11 LAB — CHLAMYDIA/NGC RT PCR (ARMC ONLY)
Chlamydia Tr: NOT DETECTED
N gonorrhoeae: NOT DETECTED

## 2020-09-11 LAB — CBC
HCT: 33.8 % — ABNORMAL LOW (ref 36.0–46.0)
Hemoglobin: 10.8 g/dL — ABNORMAL LOW (ref 12.0–15.0)
MCH: 27.8 pg (ref 26.0–34.0)
MCHC: 32 g/dL (ref 30.0–36.0)
MCV: 86.9 fL (ref 80.0–100.0)
Platelets: 360 10*3/uL (ref 150–400)
RBC: 3.89 MIL/uL (ref 3.87–5.11)
RDW: 12.6 % (ref 11.5–15.5)
WBC: 9.5 10*3/uL (ref 4.0–10.5)
nRBC: 0 % (ref 0.0–0.2)

## 2020-09-11 LAB — COMPREHENSIVE METABOLIC PANEL
ALT: 22 U/L (ref 0–44)
AST: 17 U/L (ref 15–41)
Albumin: 3 g/dL — ABNORMAL LOW (ref 3.5–5.0)
Alkaline Phosphatase: 59 U/L (ref 38–126)
Anion gap: 8 (ref 5–15)
BUN: 9 mg/dL (ref 6–20)
CO2: 24 mmol/L (ref 22–32)
Calcium: 8.3 mg/dL — ABNORMAL LOW (ref 8.9–10.3)
Chloride: 108 mmol/L (ref 98–111)
Creatinine, Ser: 0.74 mg/dL (ref 0.44–1.00)
GFR, Estimated: >60 mL/min (ref >60)
Glucose, Bld: 97 mg/dL (ref 70–99)
Potassium: 3.6 mmol/L (ref 3.5–5.1)
Sodium: 140 mmol/L (ref 135–145)
Total Bilirubin: 0.5 mg/dL (ref 0.3–1.2)
Total Protein: 6.6 g/dL (ref 6.5–8.1)

## 2020-09-11 LAB — POC URINE PREG, ED: Preg Test, Ur: NEGATIVE

## 2020-09-11 MED ORDER — OXYCODONE-ACETAMINOPHEN 5-325 MG PO TABS: 2.0000 | ORAL_TABLET | Freq: Four times a day (QID) | ORAL | 0 refills | Status: DC | PRN

## 2020-09-11 MED ORDER — IOHEXOL 350 MG/ML SOLN
100.0000 mL | Freq: Once | INTRAVENOUS | Status: AC | PRN
  Administered 2020-09-11: 100 mL via INTRAVENOUS

## 2020-09-11 MED ORDER — SODIUM CHLORIDE 0.9 % IV SOLN
1.0000 g | Freq: Once | INTRAVENOUS | Status: AC
  Administered 2020-09-11: 1 g via INTRAVENOUS
  Filled 2020-09-11: qty 10

## 2020-09-11 MED ORDER — ONDANSETRON HCL 4 MG/2ML IJ SOLN
4.0000 mg | INTRAMUSCULAR | Status: AC
  Administered 2020-09-11: 4 mg via INTRAVENOUS
  Filled 2020-09-11: qty 2

## 2020-09-11 MED ORDER — CEFADROXIL 500 MG PO CAPS: 1000.0000 mg | ORAL_CAPSULE | Freq: Two times a day (BID) | ORAL | 0 refills | Status: AC

## 2020-09-11 MED ORDER — MORPHINE SULFATE (PF) 4 MG/ML IV SOLN
4.0000 mg | Freq: Once | INTRAVENOUS | Status: AC
  Administered 2020-09-11: 4 mg via INTRAVENOUS
  Filled 2020-09-11: qty 1

## 2020-09-11 MED ORDER — ONDANSETRON 4 MG PO TBDP: ORAL_TABLET | ORAL | 0 refills | Status: DC

## 2020-09-11 NOTE — ED Notes (Signed)
Patient given warm blanket and ginger ale as requested.

## 2020-09-11 NOTE — Discharge Instructions (Addendum)
Your workup today suggests that you have a urinary tract infection (UTI) which has spread to your kidneys.  Please take your antibiotic as prescribed and over-the-counter pain medication (Tylenol or Motrin) as needed, but no more than recommended on the label instructions.  Drink PLENTY of fluids.  Take Perocet as prescribed for severe pain. Do not drink alcohol, drive or participate in any other potentially dangerous activities while taking this medication as it may make you sleepy. Do not take this medication with any other sedating medications, either prescription or over-the-counter. If you were prescribed Percocet or Vicodin, do not take these with acetaminophen (Tylenol) as it is already contained within these medications.   This medication is an opiate (or narcotic) pain medication and can be habit forming.  Use it as little as possible to achieve adequate pain control.  Do not use or use it with extreme caution if you have a history of opiate abuse or dependence.  If you are on a pain contract with your primary care doctor or a pain specialist, be sure to let them know you were prescribed this medication today from the [facility] Emergency Department.  This medication is intended for your use only - do not give any to anyone else and keep it in a secure place where nobody else, especially children, have access to it.  It will also cause or worsen constipation, so you may want to consider taking an over-the-counter stool softener while you are taking this medication.   Your workup in the Emergency Department today was reassuring.  We did not find any specific abnormalities.  We recommend you drink plenty of fluids, take your regular medications and/or any new ones prescribed today, and follow up with the doctor(s) listed in these documents as recommended.  Return to the Emergency Department if you develop new or worsening symptoms that concern you.  Call your regular doctor to schedule the  next available appointment to follow up on today's ED visit, or return immediately to the ED if your pain worsens, you have decreased urine production, develop fever, persistent vomiting, or other symptoms that concern you.
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
## 2020-09-11 NOTE — ED Triage Notes (Signed)
Pt in with body aches since last week, has urinary frequency and urgency. Now having llq pain, no fever.

## 2020-09-13 LAB — URINE CULTURE: Culture: 70000 — AB

## 2020-09-24 ENCOUNTER — Other Ambulatory Visit: Payer: Self-pay

## 2020-09-24 ENCOUNTER — Encounter: Payer: Self-pay | Admitting: Obstetrics and Gynecology

## 2020-09-24 ENCOUNTER — Ambulatory Visit (INDEPENDENT_AMBULATORY_CARE_PROVIDER_SITE_OTHER): Payer: Medicaid Other | Admitting: Obstetrics and Gynecology

## 2020-09-24 VITALS — BP 120/77 | HR 57 | Ht 64.0 in | Wt 169.0 lb

## 2020-09-24 DIAGNOSIS — N926 Irregular menstruation, unspecified: Secondary | ICD-10-CM

## 2020-09-24 DIAGNOSIS — N941 Unspecified dyspareunia: Secondary | ICD-10-CM | POA: Diagnosis not present
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
## 2020-10-07 ENCOUNTER — Encounter: Payer: Medicaid Other | Admitting: Obstetrics and Gynecology

## 2020-10-07 ENCOUNTER — Encounter: Payer: Self-pay | Admitting: Obstetrics and Gynecology

## 2020-10-07 ENCOUNTER — Other Ambulatory Visit: Payer: Self-pay

## 2020-10-07 ENCOUNTER — Ambulatory Visit (INDEPENDENT_AMBULATORY_CARE_PROVIDER_SITE_OTHER): Payer: Medicaid Other | Admitting: Obstetrics and Gynecology

## 2020-10-07 VITALS — BP 110/70 | Wt 165.0 lb

## 2020-10-07 DIAGNOSIS — R1033 Periumbilical pain: Secondary | ICD-10-CM
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
## 2020-10-15 NOTE — Progress Notes (Signed)
error 

## 2021-01-21 ENCOUNTER — Ambulatory Visit: Payer: Medicaid Other | Admitting: Obstetrics and Gynecology

## 2021-10-13 ENCOUNTER — Telehealth: Payer: Self-pay | Admitting: Obstetrics & Gynecology

## 2021-10-13 NOTE — Telephone Encounter (Signed)
Patient is scheduled to see CJE on 10/19/21 at 3:15. Left message for pt to call office back to r/s

## 2021-10-19 ENCOUNTER — Ambulatory Visit (INDEPENDENT_AMBULATORY_CARE_PROVIDER_SITE_OTHER): Payer: Medicaid Other | Admitting: Obstetrics & Gynecology

## 2021-10-19 ENCOUNTER — Other Ambulatory Visit (HOSPITAL_COMMUNITY)
Admission: RE | Admit: 2021-10-19 | Discharge: 2021-10-19 | Disposition: A | Discharge status: Home / Self Care | Payer: Self-pay | Source: Ambulatory Visit | Attending: Obstetrics & Gynecology | Admitting: Obstetrics & Gynecology

## 2021-10-19 ENCOUNTER — Encounter: Payer: Self-pay | Admitting: Obstetrics & Gynecology

## 2021-10-19 VITALS — BP 108/82 | HR 72 | Resp 16 | Ht 64.0 in | Wt 173.5 lb

## 2021-10-19 DIAGNOSIS — Z0000 Encounter for general adult medical examination without abnormal findings: Secondary | ICD-10-CM | POA: Diagnosis not present

## 2021-10-19 DIAGNOSIS — Z01419 Encounter for gynecological examination (general) (routine) without abnormal findings: Secondary | ICD-10-CM

## 2021-10-19 DIAGNOSIS — Z124 Encounter for screening for malignant neoplasm of cervix: Secondary | ICD-10-CM | POA: Insufficient documentation

## 2021-10-19 NOTE — Progress Notes (Signed)
Subjective:    Lindsay Joseph is a 36 y.o. female who presents for an annual exam. The patient has complaints today to discuss. The patient is sexually active. GYN screening history: last pap: approximate date 10-2019 and was normal. The patient wears seatbelts: no. The patient participates in regular exercise: no. Has the patient ever been transfused or tattooed?: yes. The patient reports that there is not domestic violence in her life.   Menstrual History: OB History     Gravida  9   Para  6   Term      Preterm  6   AB  3   Living  5      SAB  1   IAB  1   Ectopic      Multiple      Live Births  5        Obstetric Comments  Pt states baby was born living and took 2 breaths         Menarche age: 28 No LMP recorded.    The following portions of the patient's history were reviewed and updated as appropriate: allergies, current medications, past family history, past medical history, past social history, past surgical history, and problem list.  Review of Systems A comprehensive review of systems was negative.    Objective:    BP 108/82   Pulse 72   Resp 16   Ht 5\' 4"  (1.626 m)   Wt 173 lb 8 oz (78.7 kg)   SpO2 99%   Breastfeeding No   BMI 29.78 kg/m  General appearance: alert, cooperative, and no distress Abdomen: normal findings: no organomegaly and soft, non-tender Pelvic: cervix normal in appearance, external genitalia normal, no adnexal masses or tenderness, no cervical motion tenderness, uterus normal size, shape, and consistency, and vagina normal without discharge Extremities: extremities normal, atraumatic, no cyanosis or edema.    Assessment:  GYN annual  Healthy female exam.    Plan:     All questions answered. Await pap smear results. Follow up in 1 year.    Rosario Adie, MD  10/19/2021 7:35 PM

## 2021-10-21 LAB — CYTOLOGY - PAP
Chlamydia: NEGATIVE
Comment: NEGATIVE
Comment: NEGATIVE
Comment: NORMAL
Diagnosis: NEGATIVE
Neisseria Gonorrhea: NEGATIVE
Trichomonas: NEGATIVE

## 2021-10-21 NOTE — Telephone Encounter (Signed)
Patient seen 10/19/21 with CJE

## 2022-02-02 ENCOUNTER — Ambulatory Visit (INDEPENDENT_AMBULATORY_CARE_PROVIDER_SITE_OTHER): Payer: Medicaid Other | Admitting: Obstetrics and Gynecology

## 2022-02-02 ENCOUNTER — Encounter: Payer: Self-pay | Admitting: Obstetrics and Gynecology

## 2022-02-02 VITALS — BP 101/67 | HR 77 | Ht 64.0 in | Wt 177.9 lb

## 2022-02-02 DIAGNOSIS — N979 Female infertility, unspecified: Secondary | ICD-10-CM

## 2022-02-02 DIAGNOSIS — Z3169 Encounter for other general counseling and advice on procreation: Secondary | ICD-10-CM

## 2022-02-02 DIAGNOSIS — R102 Pelvic and perineal pain: Secondary | ICD-10-CM
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
## 2022-02-02 NOTE — Progress Notes (Signed)
Patient presents today to discuss possible infertility. She states in the past never having an issue becoming pregnant until after her delivery in 2021. Patient reports cramping with menstrual cycle that lasts for 8-9 days. No additional concerns.

## 2022-02-03 ENCOUNTER — Ambulatory Visit
Admission: RE | Admit: 2022-02-03 | Discharge: 2022-02-03 | Disposition: A | Discharge status: Home / Self Care | Payer: Medicaid Other | Source: Ambulatory Visit | Attending: Obstetrics and Gynecology | Admitting: Obstetrics and Gynecology

## 2022-02-03 DIAGNOSIS — N979 Female infertility, unspecified: Secondary | ICD-10-CM | POA: Insufficient documentation

## 2022-02-04 ENCOUNTER — Telehealth: Payer: Self-pay

## 2022-02-06 ENCOUNTER — Emergency Department
Admission: EM | Admit: 2022-02-06 | Discharge: 2022-02-07 | Disposition: A | Discharge status: Home / Self Care | Payer: Medicaid Other | Attending: Emergency Medicine | Admitting: Emergency Medicine

## 2022-02-06 ENCOUNTER — Other Ambulatory Visit: Payer: Self-pay

## 2022-02-06 ENCOUNTER — Encounter: Payer: Self-pay | Admitting: Emergency Medicine

## 2022-02-06 DIAGNOSIS — R109 Unspecified abdominal pain: Secondary | ICD-10-CM | POA: Diagnosis not present

## 2022-02-06 DIAGNOSIS — N939 Abnormal uterine and vaginal bleeding, unspecified: Secondary | ICD-10-CM | POA: Insufficient documentation

## 2022-02-06 LAB — COMPREHENSIVE METABOLIC PANEL
ALT: 20 U/L (ref 0–44)
AST: 22 U/L (ref 15–41)
Albumin: 3.8 g/dL (ref 3.5–5.0)
Alkaline Phosphatase: 43 U/L (ref 38–126)
Anion gap: 6 (ref 5–15)
BUN: 16 mg/dL (ref 6–20)
CO2: 25 mmol/L (ref 22–32)
Calcium: 8.4 mg/dL — ABNORMAL LOW (ref 8.9–10.3)
Chloride: 108 mmol/L (ref 98–111)
Creatinine, Ser: 0.73 mg/dL (ref 0.44–1.00)
GFR, Estimated: >60 mL/min (ref >60)
Glucose, Bld: 120 mg/dL — ABNORMAL HIGH (ref 70–99)
Potassium: 3.5 mmol/L (ref 3.5–5.1)
Sodium: 139 mmol/L (ref 135–145)
Total Bilirubin: 0.5 mg/dL (ref 0.3–1.2)
Total Protein: 6.8 g/dL (ref 6.5–8.1)

## 2022-02-06 LAB — POC URINE PREG, ED: Preg Test, Ur: NEGATIVE

## 2022-02-06 LAB — CBC WITH DIFFERENTIAL/PLATELET
Abs Immature Granulocytes: 0.02 10*3/uL (ref 0.00–0.07)
Basophils Absolute: 0 10*3/uL (ref 0.0–0.1)
Basophils Relative: 1 %
Eosinophils Absolute: 0.1 10*3/uL (ref 0.0–0.5)
Eosinophils Relative: 1 %
HCT: 36.1 % (ref 36.0–46.0)
Hemoglobin: 11.7 g/dL — ABNORMAL LOW (ref 12.0–15.0)
Immature Granulocytes: 0 %
Lymphocytes Relative: 18 %
Lymphs Abs: 1.3 10*3/uL (ref 0.7–4.0)
MCH: 28.6 pg (ref 26.0–34.0)
MCHC: 32.4 g/dL (ref 30.0–36.0)
MCV: 88.3 fL (ref 80.0–100.0)
Monocytes Absolute: 0.6 10*3/uL (ref 0.1–1.0)
Monocytes Relative: 9 %
Neutro Abs: 5.2 10*3/uL (ref 1.7–7.7)
Neutrophils Relative %: 71 %
Platelets: 226 10*3/uL (ref 150–400)
RBC: 4.09 MIL/uL (ref 3.87–5.11)
RDW: 12.3 % (ref 11.5–15.5)
WBC: 7.3 10*3/uL (ref 4.0–10.5)
nRBC: 0 % (ref 0.0–0.2)

## 2022-02-06 LAB — SAMPLE TO BLOOD BANK

## 2022-02-06 NOTE — ED Triage Notes (Signed)
Pt to ED via POV, pt states was seen at [facility] several days ago. Pt states had a baby in 2021 and had retained POC and had D&C. Pt states noted since then 10 days after implantation in previous cycles noted bleeding and clots and cramping "worse than a period". Pt states had vaginal [place] on 1/18 and the [place] came back normal and on 1/19 started having the vaginal bleeding. Pt states increasing vaginal bleeding and large clots at this time.   Pt states has had negative home pregnancy test, however has been actively trying to get pregnant at home.

## 2022-02-06 NOTE — ED Provider Notes (Signed)
[facility] Provider Note    Event Date/Time   First MD Initiated Contact with Patient 02/06/22 2322     (approximate)   History   Vaginal Bleeding   HPI  Lindsay Joseph is a 37 y.o. female with a history of abnormal uterine bleeding who presents with vaginal bleeding, acute onset for the last several days starting after she had an ultrasound on 1/18.  The patient is actively trying to conceive, and believes this would be her "implantation day."  The bleeding is associated with lower abdominal cramping.  She denies any vomiting or diarrhea.  She has no fever or chills.  She denies urinary symptoms.  I reviewed the past medical records.  The patient was evaluated by Dr. Amalia Hailey from OB/GYN on 1/17.  The patient reported pelvic cramping and infertility.  There was concern for possible PID and retained products.  She was scheduled for an ultrasound and the plan was for hysterosalpingography if the ultrasound was negative.  [place] on 1/18 showed a small amount of fluid in the endometrial canal but was otherwise unremarkable.   Physical Exam   Triage Vital Signs: ED Triage Vitals  Enc Vitals Group     BP 02/06/22 2058 128/67     Pulse Rate 02/06/22 2058 77     Resp 02/06/22 2058 19     Temp 02/06/22 2058 97.9 F (36.6 C)     Temp Source 02/06/22 2058 Oral     SpO2 02/06/22 2058 97 %     Weight 02/06/22 2059 177 lb 14.4 oz (80.7 kg)     Height 02/06/22 2059 5\' 4"  (1.626 m)     Head Circumference --      Peak Flow --      Pain Score 02/06/22 2058 5     Pain Loc --      Pain Edu? --      Excl. in [place]? --     Most recent vital signs: Vitals:   02/06/22 2058 02/07/22 0201  BP: 128/67 112/80  Pulse: 77 72  Resp: 19 18  Temp: 97.9 F (36.6 C) 98.2 F (36.8 C)  SpO2: 97% 100%     General: Awake, no distress.  CV:  Good peripheral perfusion.  Resp:  Normal effort.  Abd:  Soft and nontender.  No distention.  Other:  Normal external genitalia.  Small  amount of blood in the vagina on exam with no pooling.  No CMT or adnexal tenderness.  Cervical os closed.   ED Results / Procedures / Treatments   Labs (all labs ordered are listed, but only abnormal results are displayed) Labs Reviewed  CBC WITH DIFFERENTIAL/PLATELET - Abnormal; Notable for the following components:      Result Value   Hemoglobin 11.7 (*)    All other components within normal limits  COMPREHENSIVE METABOLIC PANEL - Abnormal; Notable for the following components:   Glucose, Bld 120 (*)    Calcium 8.4 (*)    All other components within normal limits  POC URINE PREG, ED  SAMPLE TO BLOOD BANK     EKG     RADIOLOGY  US pelvis: I independently viewed and interpreted the images; there is mild endometrial thickening and no free fluid.  Radiology report indicates endometrial thickening and normal-appearing ovaries.   PROCEDURES:  Critical Care performed: No  Procedures   MEDICATIONS ORDERED IN ED: Medications - No data to display   IMPRESSION / MDM / ASSESSMENT AND PLAN /  ED COURSE  I reviewed the triage vital signs and the nursing notes.  37 year old female with PMH as noted above currently being worked up for abnormal vaginal bleeding reports increased bleeding for the last few days after an outpatient ultrasound.  She also has some cramping.  Lab workup is reassuring with no leukocytosis or significant anemia.  Electrolytes are normal.  Urine pregnancy is negative.  Differential diagnosis includes, but is not limited to, endometriosis, fibroid, dysfunctional uterine bleeding.  Physical exam shows no significant pooling in the vaginal vault.  We will obtain an ultrasound for further evaluation.  Patient's presentation is most consistent with acute complicated illness / injury requiring diagnostic workup.   Ultrasound shows no acute findings.  On reassessment the patient is comfortable.  She is stable for discharge home with outpatient follow-up.  I gave  her strict return precautions and she expresses understanding.   FINAL CLINICAL IMPRESSION(S) / ED DIAGNOSES   Final diagnoses:  Abnormal vaginal bleeding     Rx / DC Orders   ED Discharge Orders     None        Note:  This document was prepared using Dragon voice recognition software and may include unintentional dictation errors.    Arta Silence, MD 02/07/22 [id number]

## 2022-02-07 ENCOUNTER — Emergency Department: Payer: Medicaid Other

## 2022-02-07 NOTE — Discharge Instructions (Signed)
Call Dr. Amalia Hailey office on Monday to arrange for follow-up.  Return to the ER for new, worsening, or persistent severe bleeding, cramping or pain, weakness or lightheadedness, or any other new or worsening symptoms that concern you.

## 2022-02-10 ENCOUNTER — Ambulatory Visit (INDEPENDENT_AMBULATORY_CARE_PROVIDER_SITE_OTHER): Payer: Medicaid Other | Admitting: Obstetrics and Gynecology

## 2022-02-10 ENCOUNTER — Encounter: Payer: Self-pay | Admitting: Obstetrics and Gynecology

## 2022-02-10 VITALS — BP 106/72 | HR 73 | Ht 64.0 in | Wt 177.7 lb

## 2022-02-10 DIAGNOSIS — N939 Abnormal uterine and vaginal bleeding, unspecified: Secondary | ICD-10-CM | POA: Diagnosis not present

## 2022-02-10 DIAGNOSIS — R102 Pelvic and perineal pain: Secondary | ICD-10-CM | POA: Diagnosis not present

## 2022-02-10 NOTE — Progress Notes (Signed)
Patient presents today for an ED follow-up due to abnormal bleeding. She states after an ultrasound 1/18 she began to have heavy bleeding along with right sided pain.
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
## 2022-02-16 ENCOUNTER — Telehealth: Payer: Self-pay

## 2022-02-16 NOTE — Telephone Encounter (Signed)
Returned patients call in reference to scheduling her HSG on the 7th or 8th day of her cycle. Patient states her LMP was 01/18/2022. Patient states next cycle was not due to begin until 02/18/2022. Patient states she started bleeding extremely heavy and clotting on 02/04/2022. Pt states bleeding was so heavy that she went to the ER. Pt states bleeding lasted for 10 days. Patient states she knows it was not a cycle because she did not ovulate afterwards. Patient states she is unsure when her next cycle will be. I have informed the patient to contact [facility] as soon as she begin to bleed so we can schedule her within 6 days of start date.

## 2022-02-16 NOTE — Telephone Encounter (Signed)
Pt called triage returned missed call from [place].

## 2022-03-08 ENCOUNTER — Other Ambulatory Visit: Payer: Self-pay

## 2022-03-08 ENCOUNTER — Telehealth: Payer: Self-pay | Admitting: Obstetrics and Gynecology

## 2022-03-08 ENCOUNTER — Encounter (INDEPENDENT_AMBULATORY_CARE_PROVIDER_SITE_OTHER): Payer: Self-pay

## 2022-03-08 DIAGNOSIS — N979 Female infertility, unspecified: Secondary | ICD-10-CM

## 2022-03-08 NOTE — Telephone Encounter (Signed)
Patient called to schedule her HSG. LMP on 03/08/2022. Patient has been scheduled 03/15/22 at 12 noon at [facility]

## 2022-03-15 ENCOUNTER — Other Ambulatory Visit: Payer: Self-pay | Admitting: Obstetrics and Gynecology

## 2022-03-15 ENCOUNTER — Ambulatory Visit
Admission: RE | Admit: 2022-03-15 | Discharge: 2022-03-15 | Disposition: A | Discharge status: Home / Self Care | Payer: Medicaid Other | Source: Ambulatory Visit | Attending: Obstetrics and Gynecology | Admitting: Obstetrics and Gynecology

## 2022-03-15 DIAGNOSIS — N979 Female infertility, unspecified: Secondary | ICD-10-CM | POA: Diagnosis present

## 2022-03-15 MED ORDER — IBUPROFEN 800 MG PO TABS: 800.0000 mg | ORAL_TABLET | Freq: Three times a day (TID) | ORAL | 0 refills | Status: DC | PRN

## 2022-03-15 MED ORDER — IOHEXOL 300 MG/ML  SOLN
30.0000 mL | Freq: Once | INTRAMUSCULAR | Status: AC | PRN
  Administered 2022-03-15: 30 mL

## 2022-03-15 MED ORDER — DOXYCYCLINE HYCLATE 100 MG PO CAPS: 100.0000 mg | ORAL_CAPSULE | Freq: Two times a day (BID) | ORAL | 0 refills | Status: AC

## 2022-04-07 ENCOUNTER — Other Ambulatory Visit (HOSPITAL_COMMUNITY)
Admission: RE | Admit: 2022-04-07 | Discharge: 2022-04-07 | Disposition: A | Discharge status: Home / Self Care | Payer: Medicaid Other | Source: Ambulatory Visit | Attending: Obstetrics and Gynecology | Admitting: Obstetrics and Gynecology

## 2022-04-07 ENCOUNTER — Ambulatory Visit (INDEPENDENT_AMBULATORY_CARE_PROVIDER_SITE_OTHER): Payer: Medicaid Other | Admitting: Obstetrics and Gynecology

## 2022-04-07 ENCOUNTER — Encounter: Payer: Self-pay | Admitting: Obstetrics and Gynecology

## 2022-04-07 VITALS — BP 96/70 | HR 69 | Resp 16 | Ht 64.0 in | Wt 174.3 lb

## 2022-04-07 DIAGNOSIS — Z113 Encounter for screening for infections with a predominantly sexual mode of transmission: Secondary | ICD-10-CM | POA: Diagnosis present

## 2022-04-07 DIAGNOSIS — N979 Female infertility, unspecified: Secondary | ICD-10-CM | POA: Diagnosis not present

## 2022-04-07 NOTE — Progress Notes (Signed)
    GYNECOLOGY PROGRESS NOTE  Subjective:    Patient ID: Lindsay Joseph, female    DOB: January 10, 1986, 37 y.o.   MRN: [id number]  HPI  Patient is a 37 y.o. [id number] female who presents for STD testing and to further discuss results HSG. She denies symptoms of STD, she would just like to be checked.   Is still very concerned about her infertility. She reports no previous issues getting pregnant in the past. She has been trying since 2021.  Partner has 4 kids, patient has 2 (fathered by current partner). H/o D&C after last child for retained placenta after 3 weeks. Has taken ovulation kits which have been positive in the past however notes that most recent month she did not get a positive result. Had an abnormal episode of bleeding last month for which she was seen in the ER.   The following portions of the patient's history were reviewed and updated as appropriate: allergies, current medications, past family history, past medical history, past social history, past surgical history, and problem list.  Review of Systems Pertinent items noted in HPI and remainder of comprehensive ROS otherwise negative.   Objective:  Blood pressure 96/70, pulse 69, resp. rate 16, height 5\' 4"  (1.626 m), weight 174 lb 4.8 oz (79.1 kg), last menstrual period 04/04/2022. Marland Kitchen  General appearance: alert, cooperative, and no distress Remainder of exam deferred.     Imaging:  DG Hysterogram (HSG) CLINICAL DATA:  Infertility.  EXAM: HYSTEROSALPINGOGRAM  TECHNIQUE: Hysterosalpingogram was performed by the ordering physician under fluoroscopy. Fluoroscopic images were submitted for radiologic interpretation following the procedure. Please see the procedural report for the amount of contrast and the fluoroscopy time utilized.  COMPARISON:  None Available.  FINDINGS: The endometrial cavity is normal in appearance and contour. No signs of mullerian duct anomaly.  Opacification of both fallopian tubes is seen.  Both tubes appear normal. Intraperitoneal spill of contrast from both fallopian tubes is demonstrated.  IMPRESSION: Normal study. Both fallopian tubes are patent.  Electronically Signed   By: Kerby Moors M.D.   On: 03/15/2022 16:09   Assessment:   1. Infertility, female   2. Screen for STD (sexually transmitted disease)      Plan:   Unclear cause of secondary infertility at this time. Patient with regular cycles, same partner, appears to be ovulating based on having regular cycles.  Lengthy discussion had with patient regarding her history and workup thus far. Discussed option of performing additional labs (Day 3 FSH/LH, and Day 21 progesterone with this cycle. Also will order AMH. Reviewed HSG results, normal.  STD screening today, including serology at patient's request.     A total of 25 minutes were spent face-to-face with the patient during this encounter and over half of that time involved counseling and coordination of care.    Rubie Maid, MD [place]

## 2022-04-08 LAB — FSH/LH
FSH: 7.1 m[IU]/mL
LH: 7 m[IU]/mL

## 2022-04-08 LAB — HEPATITIS B SURFACE ANTIGEN: Hepatitis B Surface Ag: NEGATIVE

## 2022-04-08 LAB — RPR: RPR Ser Ql: NONREACTIVE

## 2022-04-08 LAB — HEPATITIS C ANTIBODY: Hep C Virus Ab: NONREACTIVE

## 2022-04-08 LAB — HIV ANTIBODY (ROUTINE TESTING W REFLEX): HIV Screen 4th Generation wRfx: NONREACTIVE

## 2022-04-11 LAB — CERVICOVAGINAL ANCILLARY ONLY
Chlamydia: POSITIVE — AB
Comment: NEGATIVE
Comment: NEGATIVE
Comment: NORMAL
Neisseria Gonorrhea: NEGATIVE
Trichomonas: NEGATIVE

## 2022-04-11 LAB — ANTI MULLERIAN HORMONE: ANTI-MULLERIAN HORMONE (AMH): 1.92 ng/mL

## 2022-04-12 ENCOUNTER — Other Ambulatory Visit: Payer: Self-pay

## 2022-04-12 MED ORDER — AZITHROMYCIN 1 G PO PACK: 1.0000 g | PACK | Freq: Once | ORAL | 1 refills | Status: AC

## 2022-04-25 ENCOUNTER — Other Ambulatory Visit: Payer: Medicaid Other

## 2022-04-25 ENCOUNTER — Telehealth: Payer: Self-pay | Admitting: Obstetrics and Gynecology

## 2022-04-25 DIAGNOSIS — N979 Female infertility, unspecified: Secondary | ICD-10-CM

## 2022-04-25 NOTE — Telephone Encounter (Signed)
The TOC appt was scheduled for 05/02/2022.  I reached out to let the pt know that she can schedule a visit with you after all of the test results are in.  I left a message for pt to call back to schedule.

## 2022-04-25 NOTE — Telephone Encounter (Signed)
This pt came in for lab work today.  She was wondering when she will hear about results from her last lab work.  She was thinking that she was going to see a provider.  Will you give her a call concerning today's lab work and the previous lab work that was done?  Thank you.

## 2022-04-25 NOTE — Telephone Encounter (Signed)
She does not need to see me until all of her labs were resulted. She does however need a TOC visit for recent infection.  If this has not been scheduled, she can have a visit with me and we can cover all topics.

## 2022-04-25 NOTE — Telephone Encounter (Signed)
Can we see if we can do her TOC and visit with me at the same visit on another day?

## 2022-04-26 LAB — PROGESTERONE: Progesterone: 8.8 ng/mL

## 2022-04-26 NOTE — Telephone Encounter (Signed)
The patient is scheduled for 5/1 for Discuss HSG/next steps  with Dr. Valentino Saxon. The patient confirmed 4/15 nurse visit.

## 2022-05-02 ENCOUNTER — Ambulatory Visit: Payer: Medicaid Other

## 2022-05-18 ENCOUNTER — Ambulatory Visit (INDEPENDENT_AMBULATORY_CARE_PROVIDER_SITE_OTHER): Payer: Medicaid Other | Admitting: Obstetrics and Gynecology

## 2022-05-18 ENCOUNTER — Other Ambulatory Visit (HOSPITAL_COMMUNITY)
Admission: RE | Admit: 2022-05-18 | Discharge: 2022-05-18 | Disposition: A | Discharge status: Home / Self Care | Payer: Medicaid Other | Source: Ambulatory Visit | Attending: Obstetrics and Gynecology | Admitting: Obstetrics and Gynecology

## 2022-05-18 ENCOUNTER — Encounter: Payer: Self-pay | Admitting: Obstetrics and Gynecology

## 2022-05-18 VITALS — BP 107/78 | HR 79 | Resp 16 | Ht 64.0 in | Wt 176.9 lb

## 2022-05-18 DIAGNOSIS — A749 Chlamydial infection, unspecified: Secondary | ICD-10-CM | POA: Insufficient documentation

## 2022-05-18 DIAGNOSIS — B9689 Other specified bacterial agents as the cause of diseases classified elsewhere: Secondary | ICD-10-CM | POA: Diagnosis not present

## 2022-05-18 DIAGNOSIS — N979 Female infertility, unspecified: Secondary | ICD-10-CM

## 2022-05-18 DIAGNOSIS — N760 Acute vaginitis: Secondary | ICD-10-CM | POA: Diagnosis not present

## 2022-05-18 DIAGNOSIS — Z3169 Encounter for other general counseling and advice on procreation: Secondary | ICD-10-CM

## 2022-05-18 MED ORDER — METFORMIN HCL 500 MG PO TABS: 500.0000 mg | ORAL_TABLET | Freq: Every day | ORAL | 1 refills | Status: DC

## 2022-05-18 MED ORDER — CLOMIPHENE CITRATE 50 MG PO TABS: 50.0000 mg | ORAL_TABLET | Freq: Every day | ORAL | 3 refills | Status: DC

## 2022-05-18 NOTE — Progress Notes (Signed)
GYNECOLOGY PROGRESS NOTE  Subjective:    Patient ID: Lindsay Joseph, female    DOB: 1985-04-09, 37 y.o.   MRN: [id number]  HPI  Patient is a 37 y.o. [id number] female who presents to follow up on fertility testing.  Patient had an HSG in February which showed bilateral efflux of dye in tubes (although right side was a little sluggish).  Recently had labs to assess for ovulation.   Patient also presents for [facility] for recent Chlamdia infection. Reports taking antibiotic therapy twice, 1 week apart. Notes that her partner at that time was also treated, however states that he is still also engaging in intercourse with another female.   The following portions of the patient's history were reviewed and updated as appropriate: allergies, current medications, past family history, past medical history, past social history, past surgical history, and problem list.  Review of Systems Pertinent items noted in HPI and remainder of comprehensive ROS otherwise negative.   Objective:   Blood pressure 107/78, pulse 79, resp. rate 16, height 5\' 4"  (1.626 m), weight 176 lb 14.4 oz (80.2 kg), last menstrual period 05/02/2022. Body mass index is 30.36 kg/m. General appearance: alert and no distress Remainder of exam deferred.   Labs:  Lab on 04/25/2022  Component Date Value Ref Range Status   Progesterone 04/25/2022 8.8  ng/mL Final   Comment:                      Follicular phase       0.1 -   0.9                      Luteal phase           1.8 -  23.9                      Ovulation phase        0.1 -  12.0                      Pregnant                         First trimester    11.0 -  44.3                         Second trimester   25.4 -  83.3                         Third trimester    58.7 - 214.0                      Postmenopausal         0.0 -   0.1   Office Visit on 04/07/2022  Component Date Value Ref Range Status   HIV Screen 4th Generation wRfx 04/07/2022 Non Reactive  Non Reactive Final    Comment: HIV Negative HIV-1/HIV-2 antibodies and HIV-1 p24 antigen were NOT detected. There is no laboratory evidence of HIV infection.    RPR Ser Ql 04/07/2022 Non Reactive  Non Reactive Final   Hep C Virus Ab 04/07/2022 Non Reactive  Non Reactive Final   Comment: HCV antibody alone does not differentiate between previously resolved infection and active infection. Equivocal and Reactive HCV antibody results should be followed up with an HCV  RNA test to support the diagnosis of active HCV infection.    Hepatitis B Surface Ag 04/07/2022 Negative  Negative Final   LH 04/07/2022 7.0  mIU/mL Final   Comment:                      Adult Female              Range                       Follicular phase      2.4 -  12.6                       Ovulation phase      14.0 -  95.6                       Luteal phase          1.0 -  11.4                       Postmenopausal        7.7 -  58.5    FSH 04/07/2022 7.1  mIU/mL Final   Comment:                      Adult Female             Range                       Follicular phase      3.5 -  12.5                       Ovulation phase       4.7 -  21.5                       Luteal phase          1.7 -   7.7                       Postmenopausal       25.8 - 134.8    Chlamydia 04/07/2022 Positive (A)   Final   Neisseria Gonorrhea 04/07/2022 Negative   Final   Trichomonas 04/07/2022 Negative   Final   Comment 04/07/2022 Normal Reference Ranger Chlamydia - Negative   Final   Comment 04/07/2022 Normal Reference Range Neisseria Gonorrhea - Negative   Final   Comment 04/07/2022 Normal Reference Range Trichomonas - Negative   Final   ANTI-MULLERIAN HORMONE (AMH) 04/07/2022 1.92  ng/mL Final   Comment: For assays employing antibodies, the possibility exists for interference by heterophile antibodies in the samples.1  1.Kricka L.  Interferences in Immunoassays - still a threat.  Clin. Chem. 2000; 46: 5784-6962.  This test was developed and its  performance characteristics determined by [facility]. It has not been cleared or approved by the Food and Drug Administration. Reference Range: Females 36 - 40y: 0.42 - 8.34 Median  1.69 AMH concentrations of >= 1.06 ng/mL is correlated with a better response to ovarian stimulation, produced more retrievable oocytes and higher odds of live birth according to Gleicher et al.  Garlon Hatchet and Sterility. 2010: 95:2841-3244.  The current AMH test method correlates with the study method with a slope of 0.94. Females at risk of ovarian hyperstimulation  syndrome or polycystic ovarian syndrome (PCOS) may exhibit elevated serum AMH concentrations.   AMH levels from PCOS patients may be 2 to 5 fold higher than age-appropriate reference in                          terval values. Granulosa cell tumors of the ovary may secrete AMH along with other tumor markers.  Elevated AMH is not specific for malignancy, and the assay should not be used exclusively to diagnose or exclude an AMH-secreting ovarian tumor.      Assessment:   1. Infertility, female   2. Chlamydia   3. Pre-conception counseling      Plan:   - Discussion had with patient regarding her labs. Notes she has concerns about implantation failure due to often having cramping around the time of what would be implantation week. At this time can recommend trial of ovulation induction medication with Clomid to assist with fertility. Would also recommend trial of Metformin to act as catalyst for Clomid. Currently not sexually active with partner, however notes that she does still plan on on conception soon (whether through sperm donation or new partner). Still desires prescription at this time. Discussed timed coitus, and remaining supine for at least 15-20 min after intercourse. Included written instructions in AVS.  - TOC performed for Chlamydia infection.  - RTC in 4 months if no pregnancy occurs, may need to increase dosing of Clomid at that  time.     A total of 25 minutes were spent face-to-face with the patient during this encounter and over half of that time involved counseling and coordination of care.  Hildred Laser, MD [place] [facility]

## 2022-05-18 NOTE — Patient Instructions (Signed)
Clomid challenge/therapy Given that patient has anovulatory cycles, discussed Clomid challenge/therapy.  She was given Rx for Provera 10mg po qd x 10 days.   She was told to watch for withdrawal bleeding after the course, the first day of bleeding will be Day 1 for her next cycle.  She was also given a Rx for Clomid 50mg po qd to take on days 5-9 of her cycle.  The risks of Clomid including ovarian hyperstimulation with possible risk of ovarian cancer as well as multiple gestation were discussed with patient.  Patient also advised to continue frequent intercourse especially around Day 14 of her upcoming cycle (qod intercourse around days 9 - 18).  By Day 35, patient was told to call if she had her period.  If patient has bleeding at end of cycle, will increase Clomid by 50mg for the following cycle; she can have a total of 6 cycles.  However if patient does not have bleeding, she was told to do a pregnancy test/come in for evaluation.   CLOMID PATIENT INSTRUCTIONS  WHY USE IT? Clomid helps your ovaries to release eggs (ovulate).  HOW TO USE IT? Clomid is taken as a pill usually on days 5,6,7,8, & 9 of your cycle.  Day 1 is the first day of your period. The dose or duration may be changed to achieve ovulation.  Provera (progesterone) may first be used to bring on a period for some patients.  If you do not get pregnant this cycle, for your next cycles, take on days 1, 2, 3, 4 and 5.  If you do not get a period, take Provera 10 mg daily for 10 days to bring on a period; the first day you get bleeding is Day 1 of your cycle. The day of ovulation on Clomid is usually between cycle day 14 and 17.  Having sexual intercourse at least every other day between cycle day 13 and 18 will improve your chances of becoming pregnant during the Clomid cycle.  You may monitor your ovulation using basal body temperature charts or with ovulation kits.  If using the ovulation predictor kits, having intercourse the day of the  surge and the two days following is recommended. If you get your period, call when it starts for an appointment with your doctor, so that an exam may be done, and another Clomid cycle can be considered if appropriate. If you do not get a period by day 35 of the cycle, please get a blood pregnancy test.  If it is negative, speak to your doctor for instructions to bring on another period and to plan a follow-up appointment.  THINGS TO KNOW: If you get pregnant while using Clomid, your chance of twins is 7% and triplets is less than 1%. Some studies have suggested the use of "fertility drugs" may increase your risk of ovarian cancers in the future.  It is unclear if these drugs increase the risk, or people who have problems with fertility are prone for these cancers.  If there is an actual risk, it is very low.  If you have a history of liver problems or ovarian cancer, it may be wise to avoid this medication.  SIDE EFFECTS: The most common side effect is hot flashes (20%). Breast tenderness, headaches, nausea, bloating may also occur at different times. Less than 3/1,000 people have dryness or loss of hair. Persistent ovarian cysts may form from the use of this medication. Ovarian hyperstimulation syndrome is a rare side effect at low   doses. Visual changes like flashes of light or blurring.      

## 2022-05-19 ENCOUNTER — Telehealth: Payer: Self-pay

## 2022-05-19 NOTE — Telephone Encounter (Signed)
Medication was refilled and sent to pharmacy 05/18/22. KW

## 2022-05-20 LAB — CERVICOVAGINAL ANCILLARY ONLY
Bacterial Vaginitis (gardnerella): POSITIVE — AB
Candida Glabrata: NEGATIVE
Candida Vaginitis: NEGATIVE
Chlamydia: NEGATIVE
Comment: NEGATIVE
Comment: NEGATIVE
Comment: NEGATIVE
Comment: NEGATIVE
Comment: NEGATIVE
Comment: NORMAL
Neisseria Gonorrhea: NEGATIVE
Trichomonas: NEGATIVE

## 2022-05-23 ENCOUNTER — Other Ambulatory Visit: Payer: Self-pay

## 2022-05-23 DIAGNOSIS — B9689 Other specified bacterial agents as the cause of diseases classified elsewhere: Secondary | ICD-10-CM

## 2022-05-23 MED ORDER — METRONIDAZOLE 500 MG PO TABS: 500.0000 mg | ORAL_TABLET | Freq: Two times a day (BID) | ORAL | 0 refills | Status: DC

## 2022-05-23 NOTE — Addendum Note (Signed)
Addended by: Tommie Raymond on: 05/23/2022 08:52 AM   Modules accepted: Orders

## 2022-06-20 ENCOUNTER — Telehealth: Payer: Self-pay

## 2022-09-12 NOTE — Progress Notes (Unsigned)
    GYNECOLOGY PROGRESS NOTE  Subjective:    Patient ID: Lindsay Joseph, female    DOB: 08-17-1985, 38 y.o.   MRN: [id number]  HPI  Patient is a 37 y.o. [id number] female who presents for secondary infertility follow up. She has been taking Clomid 50 mg and Metformin for treatment for the past several months. Still noting regular menstrual cycles. Desires to know if she can stop taking the metformin because it causes diarrhea. She feels like giving up on trying to get pregnant.   Grey also desires to have workup for endometriosis.  States that she has been cramping all month this month, instead of only during her menses.     The following portions of the patient's history were reviewed and updated as appropriate: allergies, current medications, past family history, past medical history, past social history, past surgical history, and problem list.  Review of Systems Pertinent items are noted in HPI.   Objective:   Blood pressure 108/72, pulse 71, resp. rate 16, height 5\' 4"  (1.626 m), weight 177 lb 11.2 oz (80.6 kg), last menstrual period 09/07/2022.  Body mass index is 30.5 kg/m. 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 Medication side effect   3. 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 Patient ok to try Femara.  Prescribed 2.5 mg dosing.   2. Medication side effect - Advised that she can discontinue Metformin at this time as it is causing her side effects of diarrhea.  May also be a cause of her cramping.   3. Pelvic cramping - Patient notes a history of some cramping during her menses, now recently has had persistent cramping  all month. Discussed that it may be secondary to medication side effects of medication. Less likely that she would have a late onset initial presentation of endometriosis without any other symptoms.  However if cramping persists despite discontinuation of the Metformin and Clomid, patient can return for further evaluation.     Hildred Laser, MD [place] [facility]

## 2022-09-13 ENCOUNTER — Encounter: Payer: Self-pay | Admitting: Obstetrics and Gynecology

## 2022-09-13 ENCOUNTER — Ambulatory Visit (INDEPENDENT_AMBULATORY_CARE_PROVIDER_SITE_OTHER): Payer: Medicaid Other | Admitting: Obstetrics and Gynecology

## 2022-09-13 VITALS — BP 108/72 | HR 71 | Resp 16 | Ht 64.0 in | Wt 177.7 lb

## 2022-09-13 DIAGNOSIS — T887XXA Unspecified adverse effect of drug or medicament, initial encounter: Secondary | ICD-10-CM

## 2022-09-13 DIAGNOSIS — R102 Pelvic and perineal pain: Secondary | ICD-10-CM | POA: Diagnosis not present

## 2022-09-13 DIAGNOSIS — N979 Female infertility, unspecified: Secondary | ICD-10-CM | POA: Diagnosis not present

## 2022-09-13 MED ORDER — LETROZOLE 2.5 MG PO TABS: 2.5000 mg | ORAL_TABLET | Freq: Every day | ORAL | 0 refills | Status: DC

## 2022-09-19 ENCOUNTER — Encounter: Payer: Self-pay | Admitting: Obstetrics and Gynecology

## 2022-11-03 IMAGING — CT CT HEAD W/O CM
4 series · 17 of 47 positions shown, 19 images · non-contrast
Comparison: June 10, 2018

CLINICAL DATA: Headache.  Nonverbal.  Hyperventilation.

EXAM:
CT HEAD WITHOUT CONTRAST
TECHNIQUE: Contiguous axial images were obtained from the base of the skull
through the vertex without intravenous contrast.

[Series 2: head wo · axial · 0.43mm/px · z∈[-136,-36]mm · 7 of 28 slices shown, 9 images]
[im 4/28  brain]
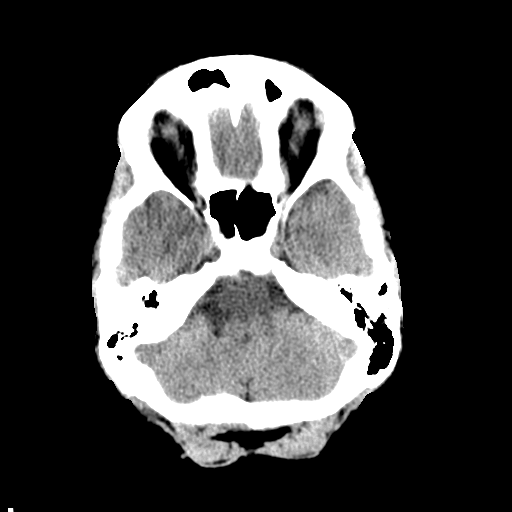
[im 4/28  bone]
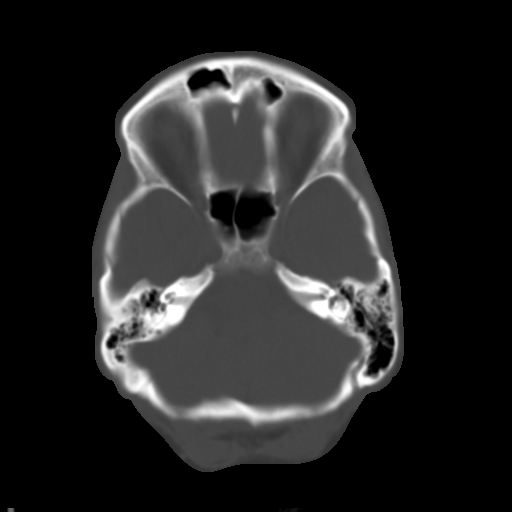
[im 7/28  brain]
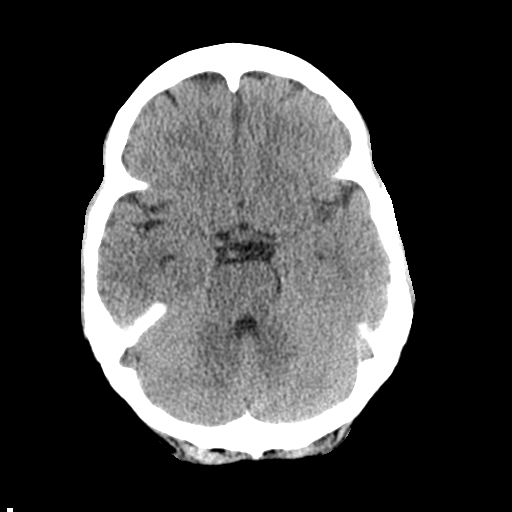
[im 11/28  brain]
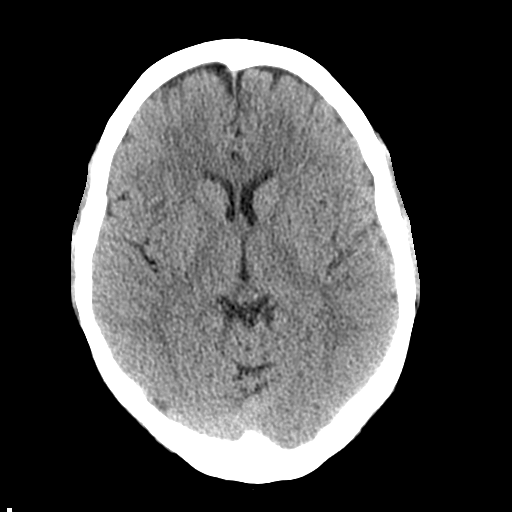
[im 14/28  brain]
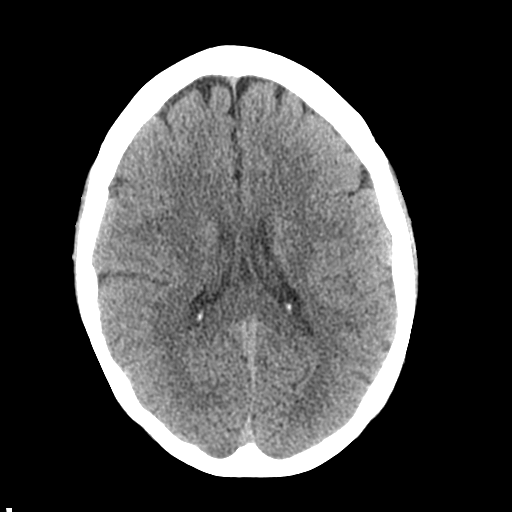
[im 17/28  brain]
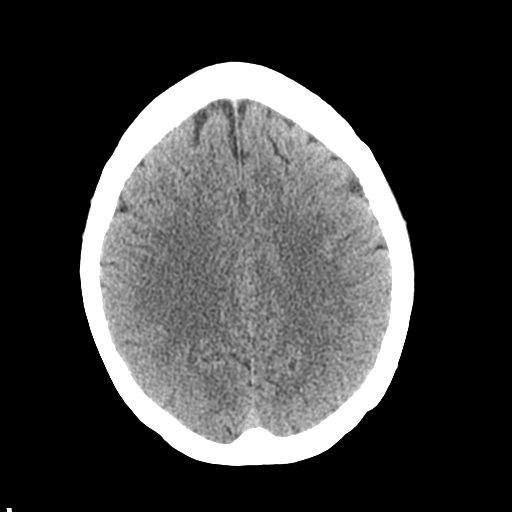
[im 17/28  bone]
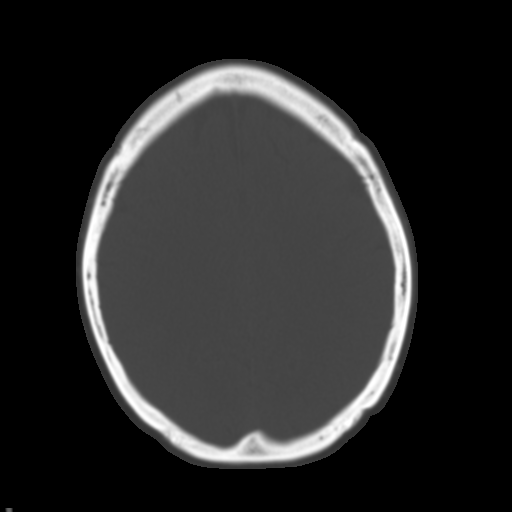
[im 21/28  brain]
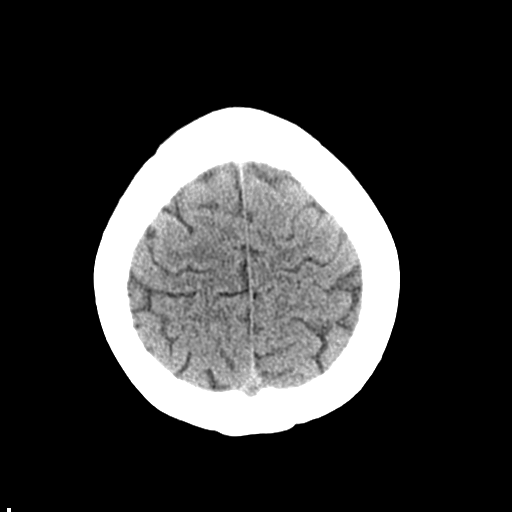
[im 24/28  brain]
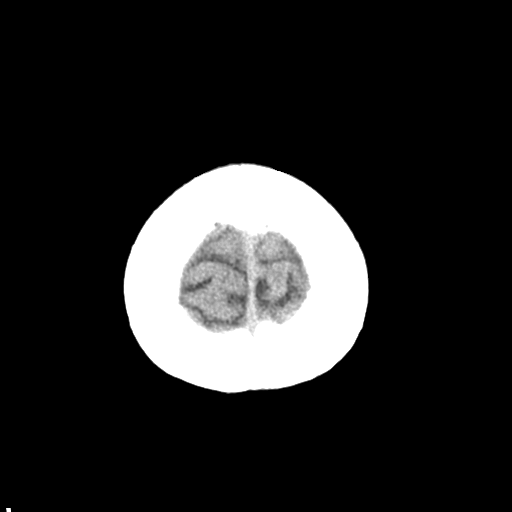

[Series 3: head bone · axial · 0.43mm/px · z∈[-139,-91]mm · 4 of 69 slices shown]
[im 7/69  bone]
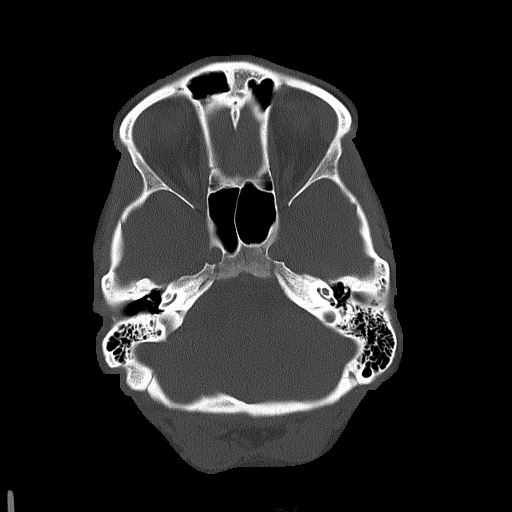
[im 14/69  bone]
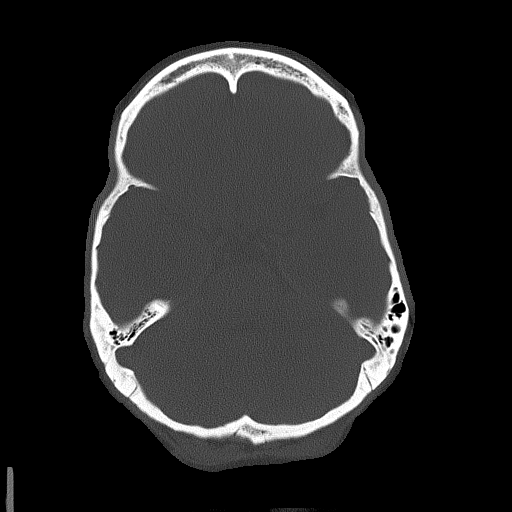
[im 21/69  bone]
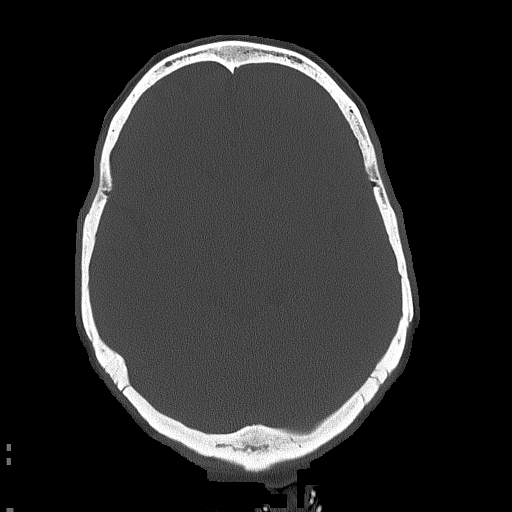
[im 31/69  bone]
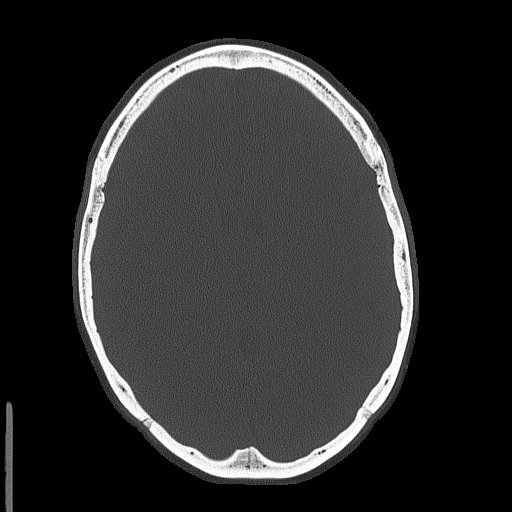

[Series 4: coronal soft tissue · coronal · 0.29mm/px · 3 of 65 slices shown]
[im 22/65  brain]
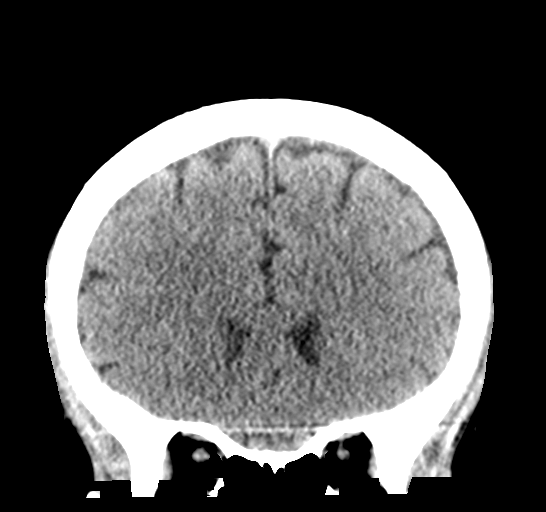
[im 29/65  brain]
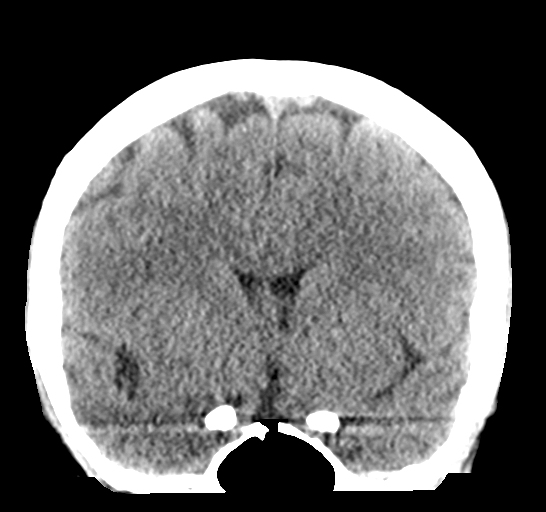
[im 36/65  brain]
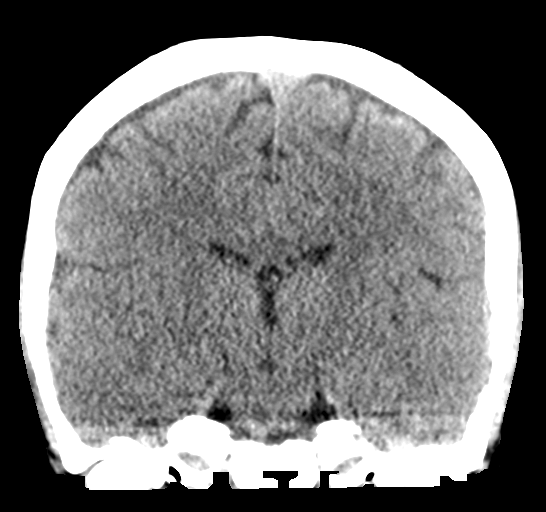

[Series 5: sagittal soft tissue · sagittal · 0.29mm/px · 3 of 54 slices shown]
[im 18/54  brain]
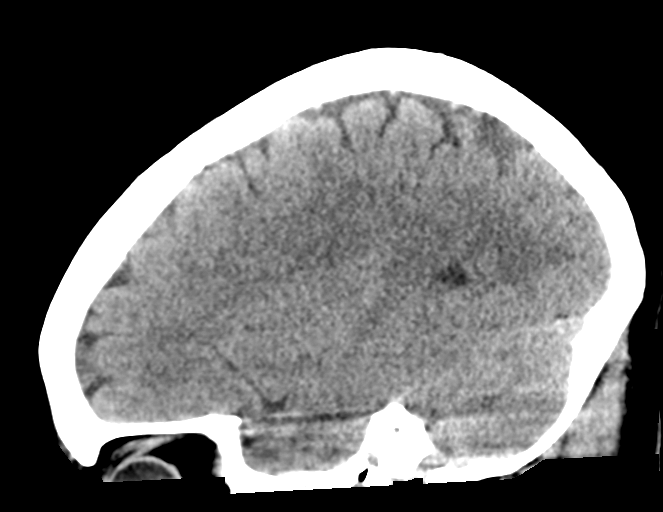
[im 27/54  brain]
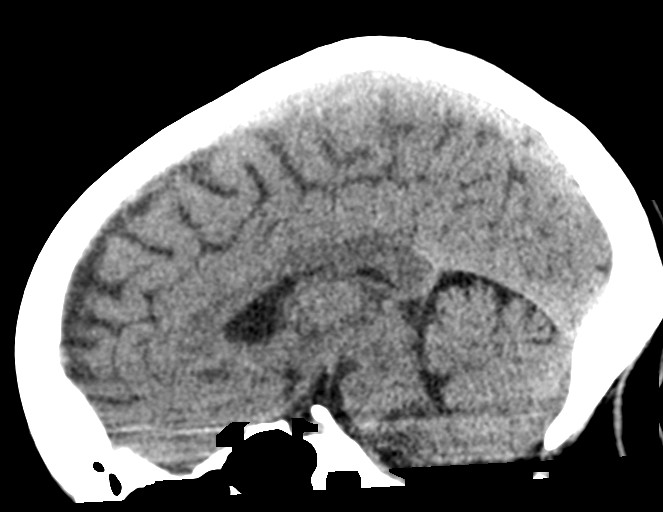
[im 36/54  brain]
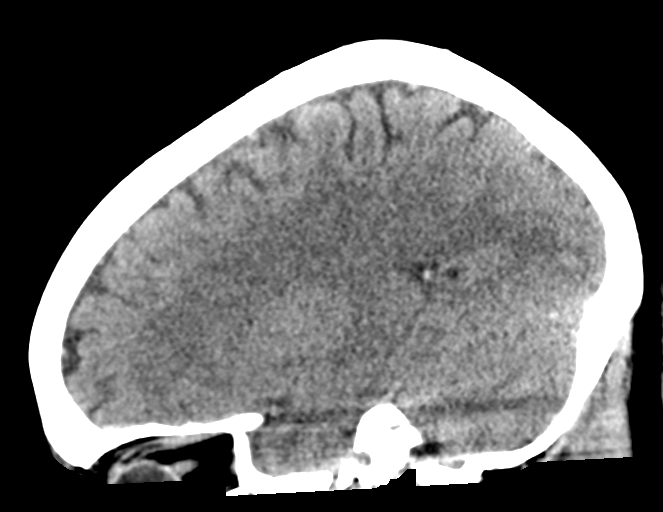

[17 of 47 positions shown; findings below may reference images not displayed]

FINDINGS: Brain: No evidence of acute infarction, hemorrhage, hydrocephalus,
extra-axial collection or mass lesion/mass effect.

Vascular: No hyperdense vessel or unexpected calcification.

Skull: Normal. Negative for fracture or focal lesion.

Sinuses/Orbits: No acute finding.

Other: None.
IMPRESSION: No acute intracranial abnormalities.

## 2022-11-08 ENCOUNTER — Other Ambulatory Visit: Payer: Self-pay | Admitting: Obstetrics and Gynecology

## 2022-11-09 MED ORDER — LETROZOLE 2.5 MG PO TABS: 2.5000 mg | ORAL_TABLET | Freq: Every day | ORAL | 0 refills | Status: DC

## 2022-12-06 ENCOUNTER — Encounter: Payer: Self-pay | Admitting: Obstetrics and Gynecology

## 2022-12-06 ENCOUNTER — Ambulatory Visit: Payer: Medicaid Other | Admitting: Obstetrics and Gynecology

## 2022-12-06 VITALS — BP 108/70 | HR 62 | Resp 16 | Ht 64.0 in | Wt 182.8 lb

## 2022-12-06 DIAGNOSIS — N979 Female infertility, unspecified: Secondary | ICD-10-CM

## 2022-12-06 DIAGNOSIS — Z113 Encounter for screening for infections with a predominantly sexual mode of transmission: Secondary | ICD-10-CM

## 2022-12-06 NOTE — Progress Notes (Signed)
    GYNECOLOGY PROGRESS NOTE  Subjective:    Patient ID: Lindsay Joseph, female    DOB: 08-06-1985, 37 y.o.   MRN: [id number]  HPI  Patient is a 37 y.o. [id number] female who presents for follow up for infertility.  She was prescribed Femara 2.5 mg and is here to follow up today after 3 cycles. Has previously tried Metformin with Clomid which did not result in pregnancy. She reports that her last menstrual cycle was abnormal, it lasted for 2 days, very light with brown blood. She has taken 6-7 home pregnancy test and all had negative results. On her ovulation day this past month she had severe abdominal pain, diarrhea with blood in her stool x 12 hours. She desires to do the 21 day test and increase the dose of Femara.  She also reports that she would like to be screened for STDs.  Patient reports that since her infection of chlamydia earlier this year caused by her "sperm donor",  she is paranoid and wants to be screened regularly.    The following portions of the patient's history were reviewed and updated as appropriate: allergies, current medications, past family history, past medical history, past social history, past surgical history, and problem list.  Review of Systems Pertinent items noted in HPI and remainder of comprehensive ROS otherwise negative.   Objective:   Resp. rate 16, height 5\' 4"  (1.626 m), weight 182 lb 12.8 oz (82.9 kg). Body mass index is 31.38 kg/m. General appearance: alert and no distress Remainder of exam deferred.    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 Also encouraged taking on Day #3 instead of Day #1 as her cycles are starting to occur more frequently )every 3-4 weeks instead of every 4. Cautioned on warning signs of ovarian hyperstimulation syndrome.  - Desires to check  progesterone levels. Advised that as she is only 10 days into her cycle, levels may not be able to assess ovulation at this time, however patient still desires. Will repeat again in 7 days as patient reports by Day 10 she typically has ovulated based on ovulation kits.  - Will also order for a patient to f/u in 3-4 weeks for ultrasound to follow follicles with next cycle. Patient advised to notify provider when next cycle occurs and utilization of Femara starts in order to time for Day 10-11  and Day 13-14 sonos.   2. Screen for STD (sexually transmitted disease) - Patient desires vaginal STD screen due to h/o chlamydia infection in March. Declines serology at this time.  Notes "sperm donor" has been treated, however still desires to have regular screens.    A total of 34 minutes were spent during this encounter, including review of previous progress notes, recent imaging and labs, face-to-face with time with patient involving counseling and coordination of care, as well as documentation for current visit.   Hildred Laser, MD [place] [facility]

## 2022-12-07 ENCOUNTER — Other Ambulatory Visit (HOSPITAL_COMMUNITY)
Admission: RE | Admit: 2022-12-07 | Discharge: 2022-12-07 | Disposition: A | Discharge status: Home / Self Care | Payer: Medicaid Other | Source: Ambulatory Visit | Attending: Obstetrics and Gynecology | Admitting: Obstetrics and Gynecology

## 2022-12-07 DIAGNOSIS — Z113 Encounter for screening for infections with a predominantly sexual mode of transmission: Secondary | ICD-10-CM | POA: Diagnosis present

## 2022-12-07 LAB — PROGESTERONE: Progesterone: 8.9 ng/mL

## 2022-12-07 NOTE — Addendum Note (Signed)
Addended by: Tommie Raymond on: 12/07/2022 08:25 AM   Modules accepted: Orders

## 2022-12-08 LAB — CERVICOVAGINAL ANCILLARY ONLY
Bacterial Vaginitis (gardnerella): POSITIVE — AB
Candida Glabrata: NEGATIVE
Candida Vaginitis: NEGATIVE
Chlamydia: NEGATIVE
Comment: NEGATIVE
Comment: NEGATIVE
Comment: NEGATIVE
Comment: NEGATIVE
Comment: NEGATIVE
Comment: NORMAL
Neisseria Gonorrhea: NEGATIVE
Trichomonas: NEGATIVE

## 2022-12-13 ENCOUNTER — Other Ambulatory Visit: Payer: Medicaid Other

## 2022-12-13 ENCOUNTER — Other Ambulatory Visit: Payer: Self-pay

## 2022-12-13 DIAGNOSIS — N979 Female infertility, unspecified: Secondary | ICD-10-CM

## 2022-12-13 DIAGNOSIS — B9689 Other specified bacterial agents as the cause of diseases classified elsewhere: Secondary | ICD-10-CM

## 2022-12-13 MED ORDER — SOLOSEC 2 G PO PACK: 2.0000 g | PACK | Freq: Once | ORAL | 0 refills | Status: AC

## 2022-12-13 MED ORDER — LETROZOLE 2.5 MG PO TABS: ORAL_TABLET | ORAL | 1 refills | Status: DC

## 2022-12-14 ENCOUNTER — Telehealth: Payer: Self-pay

## 2022-12-14 LAB — PROGESTERONE: Progesterone: 0.2 ng/mL

## 2022-12-14 NOTE — Telephone Encounter (Signed)
Patient aware [facility] approved.

## 2022-12-14 NOTE — Telephone Encounter (Signed)
Pharmacy aware PA approved. They are currently out of stock. They will order it for the patient and let her know.

## 2022-12-25 ENCOUNTER — Encounter: Payer: Self-pay | Admitting: Obstetrics and Gynecology

## 2023-01-03 ENCOUNTER — Ambulatory Visit: Payer: Medicaid Other

## 2023-01-03 DIAGNOSIS — N979 Female infertility, unspecified: Secondary | ICD-10-CM | POA: Diagnosis not present

## 2023-01-12 NOTE — Progress Notes (Unsigned)
    GYNECOLOGY PROGRESS NOTE  Subjective:    Patient ID: Lindsay Joseph, female    DOB: [birth year], 37 y.o.   MRN: [id number]  HPI  Patient is a 37 y.o. [id number] female who presents for ultrasound follow up for secondary female infertility.   Hair loss for ~ 4-5 uears.   {Common ambulatory SmartLinks:19316}  Review of Systems {ros; complete:30496}   Objective:   There were no vitals taken for this visit. There is no height or weight on file to calculate BMI. General appearance: {general exam:16600} Abdomen: {abdominal exam:16834} Pelvic: {pelvic exam:16852::"cervix normal in appearance","external genitalia normal","no adnexal masses or tenderness","no cervical motion tenderness","rectovaginal septum normal","uterus normal size, shape, and consistency","vagina normal without discharge"} Extremities: {extremity exam:5109} Neurologic: {neuro exam:17854}  ULTRASOUND REPORT   Location: [facility] Date of Service: 01/03/2023    Indications: secondary female infertility Findings:  The uterus is anteverted and measures 10.5 x 5.0 x 5.8 cm Echo texture is heterogenous without evidence of focal masses.     The Endometrium measures 7.3 mm; fluid is seen within the canal   Right Ovary measures 3.0 x 2.5 x 2.6 cm cm. It is normal in appearance with multiple follicles (6) noted.  Left Ovary measures 3.5 x 2.5 x 3.7 cm. It is normal in appearance with one follicle noted. Survey of the adnexa demonstrates no adnexal masses. There is no free fluid in the cul de sac.   Impression: 1. Multiple follicles noted on the right ovary  2. Single follicle seen on the left ovary 3. Fluid seen within the endometrial canal   Assessment:   No diagnosis found.   Plan:   There are no diagnoses linked to this encounter.    Hildred Laser, MD [place] [facility]

## 2023-01-13 ENCOUNTER — Encounter: Payer: Self-pay | Admitting: Obstetrics and Gynecology

## 2023-01-13 ENCOUNTER — Ambulatory Visit (INDEPENDENT_AMBULATORY_CARE_PROVIDER_SITE_OTHER): Payer: Medicaid Other | Admitting: Obstetrics and Gynecology

## 2023-01-13 VITALS — BP 110/74 | HR 78 | Resp 16 | Ht 64.0 in | Wt 181.5 lb

## 2023-01-13 DIAGNOSIS — L659 Nonscarring hair loss, unspecified: Secondary | ICD-10-CM | POA: Diagnosis not present

## 2023-01-13 DIAGNOSIS — N979 Female infertility, unspecified: Secondary | ICD-10-CM | POA: Diagnosis not present

## 2023-01-18 LAB — TESTOSTERONE, FREE, TOTAL, SHBG
Sex Hormone Binding: 117 nmol/L (ref 24.6–122.0)
Testosterone, Free: 2.2 pg/mL (ref 0.0–4.2)
Testosterone: 33 ng/dL (ref 8–60)

## 2023-01-18 LAB — TSH: TSH: 3.12 u[IU]/mL (ref 0.450–4.500)

## 2023-01-18 LAB — CALCIUM: Calcium: 9.9 mg/dL (ref 8.7–10.2)

## 2023-01-18 LAB — HEMOGLOBIN A1C
Est. average glucose Bld gHb Est-mCnc: 100 mg/dL
Hgb A1c MFr Bld: 5.1 % (ref 4.8–5.6)

## 2023-01-18 LAB — MAGNESIUM: Magnesium: 1.9 mg/dL (ref 1.6–2.3)

## 2023-01-18 LAB — FOLATE: Folate: 6.4 ng/mL (ref >3.0)

## 2023-01-18 LAB — VITAMIN B12: Vitamin B-12: 281 pg/mL (ref 232–1245)

## 2023-01-18 LAB — ZINC: Zinc: 70 ug/dL (ref 44–115)

## 2023-02-20 ENCOUNTER — Encounter: Payer: Self-pay | Admitting: Obstetrics and Gynecology

## 2023-02-21 ENCOUNTER — Other Ambulatory Visit: Payer: Self-pay | Admitting: Obstetrics and Gynecology

## 2023-02-21 DIAGNOSIS — N979 Female infertility, unspecified: Secondary | ICD-10-CM

## 2023-02-22 ENCOUNTER — Other Ambulatory Visit: Payer: Self-pay | Admitting: Obstetrics and Gynecology

## 2023-02-22 DIAGNOSIS — N979 Female infertility, unspecified: Secondary | ICD-10-CM

## 2023-02-24 ENCOUNTER — Encounter: Payer: Self-pay | Admitting: Obstetrics and Gynecology

## 2023-02-24 ENCOUNTER — Ambulatory Visit
Admission: RE | Admit: 2023-02-24 | Discharge: 2023-02-24 | Disposition: A | Discharge status: Home / Self Care | Payer: Medicaid Other | Source: Ambulatory Visit | Attending: Obstetrics and Gynecology | Admitting: Obstetrics and Gynecology

## 2023-02-24 DIAGNOSIS — N979 Female infertility, unspecified: Secondary | ICD-10-CM

## 2023-02-27 NOTE — Progress Notes (Signed)
GYNECOLOGY PROGRESS NOTE  Subjective:    Patient ID: Lindsay Joseph, female    DOB: 1985/03/05, 38 y.o.   MRN: [id number]  HPI  Patient is a 38 y.o. [id number] female who presents for follow up for secondary infertility. She has a hx of infertility.  Has been off infertility medications for several months. Was having a difficult time trying to figure out when ovulation occurs due to irregular bleeding.  Is still tracking her cycles and using ovulation kits. Notes darker line this time than ever before when using the kits. Thought she was ovulating before, but thinks she may not have actually been at those times of testing. Also noted right sided abdominal pain/cramping around suspected ovulation, which is what prompted her to take the test. .   The following portions of the patient's history were reviewed and updated as appropriate: allergies, current medications, past family history, past medical history, past social history, past surgical history, and problem list.  Review of Systems Pertinent items are noted in HPI.   Objective:   Blood pressure (!) 99/46, pulse 95, height 5\' 4"  (1.626 m), weight 181 lb 14.4 oz (82.5 kg), last menstrual period 02/17/2023. Body mass index is 31.22 kg/m. General appearance: alert, cooperative, and no distress Remainder of exam deferred.    Imaging:  [place] Follicle : PROCEDURE: TRANSVAGINAL ULTRASOUND OF PELVIS; ULTRASOUND FOLLICLE  HISTORY: Patient is a 38 y/o F with ovarian follicle scan for infertility. LMP 02/17/23. History of D&C 2022. Hysterogram 03/15/22.  COMPARISON: US Pelvis 01/03/23, 02/07/22  TECHNIQUE: Two-dimensional transvaginal grayscale and color Doppler ultrasound of the pelvis was performed.  FINDINGS: The uterus is anteverted in position and measures 10.2 x 5.2 x 4.6 cm. It demonstrates a normal, homogeneous echotexture. The endometrium measures 1.0 cm and demonstrates a normal homogeneous echotexture. Multiple nabothian  cysts are noted.  The right ovary measures 2.3 x 2.2 x 1.7 cm and demonstrates a normal echotexture. There is normal color Doppler flow.  The left ovary measures 2.9 x 2.7 x 1.4 cm and demonstrates a normal echotexture. There is normal color Doppler flow.  There is no fluid present within the cul-de-sac.  Right ovary  Follicles measuring 10mm or greater: 1  1.  1.5 x 1.4 x 1.4 cm  Estimated number of follicles <88mm: 9  Left ovary  Follicles measuring 10mm or greater: 2  1.  1.3 x 1.1 x 1.0 cm  2.  1.1 x 0.9 x 0.8 cm  Estimated number of follicles <68mm: 4  There is no fluid present within the cul-de-sac.  IMPRESSION:  1. Unremarkable ultrasound of the pelvis. Follicle count as described above.  Thank you for allowing [place] to assist in the care of this patient.  Electronically Signed   By: Lestine Box M.D.   On: 02/24/2023 17:27 US PELVIS TRANSVAGINAL NON-OB (TV ONLY) : PROCEDURE: TRANSVAGINAL ULTRASOUND OF PELVIS; ULTRASOUND FOLLICLE  HISTORY: Patient is a 38 y/o F with ovarian follicle scan for infertility. LMP 02/17/23. History of D&C 2022. Hysterogram 03/15/22.  COMPARISON: US Pelvis 01/03/23, 02/07/22  TECHNIQUE: Two-dimensional transvaginal grayscale and color Doppler ultrasound of the pelvis was performed.  FINDINGS: The uterus is anteverted in position and measures 10.2 x 5.2 x 4.6 cm. It demonstrates a normal, homogeneous echotexture. The endometrium measures 1.0 cm and demonstrates a normal homogeneous echotexture. Multiple nabothian cysts are noted.  The right ovary measures 2.3 x 2.2 x 1.7 cm and demonstrates a normal echotexture. There is normal color Doppler flow.  The left ovary measures 2.9 x 2.7 x 1.4 cm and demonstrates a normal echotexture. There is normal color Doppler flow.  There is no fluid present within the cul-de-sac.  Right ovary  Follicles measuring 10mm or greater: 1  1.  1.5 x 1.4 x 1.4 cm  Estimated number of  follicles <60mm: 9  Left ovary  Follicles measuring 10mm or greater: 2  1.  1.3 x 1.1 x 1.0 cm  2.  1.1 x 0.9 x 0.8 cm  Estimated number of follicles <12mm: 4  There is no fluid present within the cul-de-sac.  IMPRESSION:  1. Unremarkable ultrasound of the pelvis. Follicle count as described above.  Thank you for allowing [place] to assist in the care of this patient.  Electronically Signed   By: Lestine Box M.D.   On: 02/24/2023 17:27     Assessment:   1. Secondary female infertility      Plan:   - Patient with pre-ovulation ultrasound performed, is due for repeat scan now that she believes she has ovulated. To schedule within the next 1-2 days. Will notify of results on Mychart.  - Patient reports that she has not been sexually active this month as she and her "sperm donor" are no longer involved. Can f/u when active again.     Hildred Laser, MD [facility]

## 2023-02-28 ENCOUNTER — Emergency Department
Admission: EM | Admit: 2023-02-28 | Discharge: 2023-02-28 | Disposition: A | Discharge status: Home / Self Care | Payer: Medicaid Other | Attending: Emergency Medicine | Admitting: Emergency Medicine

## 2023-02-28 ENCOUNTER — Other Ambulatory Visit: Payer: Self-pay

## 2023-02-28 ENCOUNTER — Encounter: Payer: Self-pay | Admitting: Obstetrics and Gynecology

## 2023-02-28 ENCOUNTER — Ambulatory Visit (INDEPENDENT_AMBULATORY_CARE_PROVIDER_SITE_OTHER): Payer: Medicaid Other | Admitting: Obstetrics and Gynecology

## 2023-02-28 VITALS — BP 99/46 | HR 95 | Ht 64.0 in | Wt 181.9 lb

## 2023-02-28 DIAGNOSIS — W1839XA Other fall on same level, initial encounter: Secondary | ICD-10-CM | POA: Diagnosis not present

## 2023-02-28 DIAGNOSIS — S8992XA Unspecified injury of left lower leg, initial encounter: Secondary | ICD-10-CM | POA: Diagnosis present

## 2023-02-28 DIAGNOSIS — N979 Female infertility, unspecified: Secondary | ICD-10-CM

## 2023-02-28 DIAGNOSIS — L659 Nonscarring hair loss, unspecified: Secondary | ICD-10-CM

## 2023-02-28 NOTE — ED Provider Notes (Signed)
[facility] Provider Note    Event Date/Time   First MD Initiated Contact with Patient 02/28/23 [id number]     (approximate)   History   Knee Pain   HPI  Lindsay Joseph is a 38 y.o. female with history of left knee pain who comes in with concerns for recurrent knee pain.  Patient reports recurrent knee issues over the last year.  She reports that this started in 2023 (over one year ago) when she was drinking alcohol and fell and injured her left knee.  She states that she went to [facility] and had normal x-rays and was told that she needed to do physical therapy before her insurance company would approve a MRI.  She reports that since then she has had intermittent issues with the knee giving out.  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 She fell from about 1 foot.  She denies any pain at rest in the knee only difficulty straightening that which has been on and off and on recurrent issue since a year ago. This causes pain with straightening and mild pain with ambulation- pt reports still walking.  She reports coming in today because she needs an MRI since they wouldn't do it at ortho over a year ago.   Physical Exam   Triage Vital Signs: ED Triage Vitals  Encounter Vitals Group     BP 02/28/23 0825 114/85     Systolic BP Percentile --      Diastolic BP Percentile --      Pulse Rate 02/28/23 0825 76     Resp 02/28/23 0825 20     Temp 02/28/23 0825 97.6 F (36.4 C)     Temp Source 02/28/23 0825 Oral     SpO2 02/28/23 0825 98 %     Weight 02/28/23 0826 182 lb (82.6 kg)     Height 02/28/23 0826 5\' 4"  (1.626 m)     Head Circumference --      Peak Flow --      Pain Score 02/28/23 0824 0     Pain Loc --      Pain Education --      Exclude from Growth Chart --     Most recent vital signs: Vitals:    02/28/23 0825  BP: 114/85  Pulse: 76  Resp: 20  Temp: 97.6 F (36.4 C)  SpO2: 98%     General: Awake, no distress.  CV:  Good peripheral perfusion.  Resp:  Normal effort.  Abd:  No distention.  Other:  Good distal pulse.  Able to flex and extend the knee patient is not able to fully straighten it by a few degrees. Sensation intact.  Able to flex and extend the ankle.  No calf tenderness no swelling of the leg.  No effusion no warmth.  She is got pain on the posterior aspect of the knee.  Patient is ambulatory in the room with slight limp but bearing weight on the leg.    ED Results / Procedures / Treatments   Labs (all labs ordered are listed, but only abnormal results are displayed) Labs Reviewed - No data to display    PROCEDURES:  Critical Care performed: No  Procedures   MEDICATIONS ORDERED IN ED: Medications - No data to display   IMPRESSION /  MDM / ASSESSMENT AND PLAN / ED COURSE  I reviewed the triage vital signs and the nursing notes.   Patient's presentation is most consistent with acute, uncomplicated illness.   Patient comes in with chronic knee pain for over a year with off and on feeling like it was giving out with last episode happening yesterday.  Discussed with patient if she has had any trauma to the knee when she fell or any other injuries from the fall.  She states that she would not be here unless it was just the knee gave out.  She denies any injuries from the fall declines x-ray to evaluate for any fracture.  On examination she got good distal pulse neurovascular intact.  She is got no signs of a large effusion or septic joint.  Discussed with patient that in the emergency room MRIs are reserved for acute pathology that would require an emergent intervention but patient is ambulatory with no indication..  Patient states that she took off today and has childcare and that she expects to get an MRI today.  Explained to patient that the insurance would  not cover the MRI given this is not a emergent reason to have the MRI done today and that this would need to be set up outpatient with Ortho.  I explained to her that orthopedics would need to do the intervention and so it was important to talk to them first and have them order the MRI to ensure that insurance covers it and that she is getting appropriate follow-up.  I discussed with her that there likely could be a ligament or meniscus issue but again given that she is ambulatory that this is not emergent and no indication for MRI from emergency room.  We have discussed a hinged knee brace, crutches in order to help stabilize it and prevent falls but patient declines stating that she is going to go over to [facility] today.  I have offered discussing the case with [facility] Ortho to try to set up a close appointment for her and again she declines.  Patient states "What do I have to do get an MRI here, I am going to start pretending I cant walk"    at this point I do not feel that MRI is clinically indicated and patient refuses to wait for discharge papers and ambulates easily out of the emergency room   FINAL CLINICAL IMPRESSION(S) / ED DIAGNOSES   Final diagnoses:  Injury of left knee, initial encounter     Rx / DC Orders   ED Discharge Orders     None        Note:  This document was prepared using Dragon voice recognition software and may include unintentional dictation errors.   Concha Se, MD 02/28/23 1002

## 2023-02-28 NOTE — ED Notes (Signed)
Pt out to the nsg station, asking if she needs to wait for paperwork, states that she doesn't need it and it will just go in the trash anyway, pt informed that she hasn't been flipped over in the computer for discharge so this RN is unaware if that is her plan of care, pt states they aren't going to do nothing anyway and pulling her bracelet off, pt ambulatory with a steady gait

## 2023-02-28 NOTE — ED Triage Notes (Addendum)
Pt to ED asking for MRI for L knee because her insurance won't pay for MRI until she has tried PT. Pt has L knee pain and L knee gives out on her from time to time. She states that this sometimes causes her to fall.  Pt states everything started when she fell "flat on her face" on new years eve of 2023 when she was drunk and ever since then, her L knee has been giving out on her. 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  Only has pain when she walks.   Seen recently at [facility] and had normal xrays.

## 2023-02-28 NOTE — Discharge Instructions (Signed)
Offered to provide you with a hinged knee brace, crutches, call orthopedics to get a close follow-up but you have opted to decline and plan to go to [facility] for evaluation.

## 2023-03-01 ENCOUNTER — Ambulatory Visit
Admission: RE | Admit: 2023-03-01 | Discharge: 2023-03-01 | Disposition: A | Discharge status: Home / Self Care | Payer: Medicaid Other | Source: Ambulatory Visit | Attending: Obstetrics and Gynecology | Admitting: Obstetrics and Gynecology

## 2023-03-01 ENCOUNTER — Other Ambulatory Visit: Payer: Self-pay | Admitting: Obstetrics and Gynecology

## 2023-03-01 DIAGNOSIS — N979 Female infertility, unspecified: Secondary | ICD-10-CM | POA: Diagnosis present

## 2023-03-06 ENCOUNTER — Encounter: Payer: Self-pay | Admitting: Obstetrics and Gynecology

## 2023-06-26 ENCOUNTER — Encounter: Payer: Self-pay | Admitting: Obstetrics & Gynecology

## 2023-06-26 ENCOUNTER — Ambulatory Visit (INDEPENDENT_AMBULATORY_CARE_PROVIDER_SITE_OTHER): Admitting: Obstetrics & Gynecology

## 2023-06-26 VITALS — BP 123/80 | HR 63 | Ht 64.0 in | Wt 181.0 lb

## 2023-06-26 DIAGNOSIS — N979 Female infertility, unspecified: Secondary | ICD-10-CM | POA: Diagnosis not present

## 2023-06-26 NOTE — Progress Notes (Signed)
    GYNECOLOGY PROGRESS NOTE  Subjective:    Patient ID: Lindsay Joseph, female    DOB: [birth year], 38 y.o.   MRN: [id number]  HPI  Patient is a 38 y.o. [id number] (23, 75, 36, 57, and 31 yo kids) here today for follow up of secondary infertility. She has seen Dr. Denman Fischer several times. She is requesting that we repeat the testing that has been done. She reports that the partner that she had in the past is now willing to provide sperm again.  She reports that her partner has had 5 children in the last 5 years and he "doesn't need" a semen analysis.   CT was positive 03/3032 and TOC was negative 11/2022  First day of LNMP was 06-10-2023. She would like day 21 progesterone .   The following portions of the patient's history were reviewed and updated as appropriate: allergies, current medications, past family history, past medical history, past social history, past surgical history, and problem list.  Review of Systems Pertinent items are noted in HPI.  Pap smear normal 2023  Objective:   Blood pressure 123/80, pulse 63, height 5\' 4"  (1.626 m), weight 181 lb (82.1 kg), last menstrual period 06/10/2023. Body mass index is 31.07 kg/m.  Well nourished, well hydrated White female, no apparent distress She is ambulating and conversing normally.   Assessment:   Seconday infertility  Plan:   I have re ordered progesterone  and FSH/LH. I have rec'd a referral to RE for a thorogh evaluation and treatment. She is requesting evaluation for endometriosis and will see Dr. Luster Salters in the future.

## 2023-06-28 ENCOUNTER — Other Ambulatory Visit

## 2023-06-28 ENCOUNTER — Telehealth: Payer: Self-pay | Admitting: Obstetrics & Gynecology

## 2023-06-28 NOTE — Telephone Encounter (Signed)
 Reached out to pt to reschedule lab appt that was scheduled on 6/11/205 at 9:40.  Was able to reschedule the appt to 06/28/2023 at 2:40.

## 2023-06-29 LAB — PROGESTERONE: Progesterone: 26.1 ng/mL

## 2023-06-29 LAB — FSH/LH
FSH: 4.8 m[IU]/mL
LH: 4.1 m[IU]/mL

## 2023-07-19 ENCOUNTER — Ambulatory Visit: Admitting: Obstetrics and Gynecology

## 2023-08-07 ENCOUNTER — Emergency Department
Admission: EM | Admit: 2023-08-07 | Discharge: 2023-08-07 | Discharge status: Against Medical Advice | Source: Home / Self Care

## 2023-08-14 ENCOUNTER — Ambulatory Visit: Admitting: Licensed Practical Nurse

## 2023-08-14 ENCOUNTER — Other Ambulatory Visit (HOSPITAL_COMMUNITY)
Admission: RE | Admit: 2023-08-14 | Discharge: 2023-08-14 | Disposition: A | Discharge status: Home / Self Care | Source: Ambulatory Visit | Attending: Licensed Practical Nurse | Admitting: Licensed Practical Nurse

## 2023-08-14 VITALS — BP 113/77 | HR 73 | Ht 64.0 in | Wt 181.2 lb

## 2023-08-14 DIAGNOSIS — R1030 Lower abdominal pain, unspecified: Secondary | ICD-10-CM

## 2023-08-14 DIAGNOSIS — Z3202 Encounter for pregnancy test, result negative: Secondary | ICD-10-CM | POA: Diagnosis not present

## 2023-08-14 DIAGNOSIS — R109 Unspecified abdominal pain: Secondary | ICD-10-CM | POA: Insufficient documentation

## 2023-08-14 DIAGNOSIS — Z113 Encounter for screening for infections with a predominantly sexual mode of transmission: Secondary | ICD-10-CM | POA: Diagnosis present

## 2023-08-14 LAB — POCT URINE PREGNANCY: Preg Test, Ur: NEGATIVE

## 2023-08-14 NOTE — Progress Notes (Signed)
 Center, [facility]   No chief complaint on file.   HPI:      Lindsay Joseph is a 38 y.o. [id number] whose LMP was Patient's last menstrual period was 08/04/2023 (approximate)., presents today for lower abdominal pain. She would like to be evaluated for endometriosis and PID.   Lindsay Joseph has had lower abdominal pain on and off for the last couple of months. It was really bad on Saturday, she went to the ED but did not stay because it was too busy.   The pain in her lower abdomen, it feels just like when she had an infection after the birth of her daughter, the pain occurs daily, lasts less than hour (except for Saturday the pain lasted all day). The pain is present when she does something like bends or moves or if the area is touched. The pain is not present when sitting still. When she has a BM she feels pain too. She has a BM most days, it is regular does not strain, may struggle with constipation when taking Iron supplements. Denies N/V, changes to appetite. She has gained 10lbs over the last several months. She eats like a kid-chicken nuggets, pizza rolls. She does have Acid reflux.   She is sexually active with a female partner, she desires a pregnancy. She is being seen by Dr Starla for fertility.  She did have clear discharge with a foul odor about 1 week ago, she used Monistat 7, cranberry tablets and a probiotic which seemed to help.   She noticed red bumps on her thighs and buttox, she put a steroid cream on it which seems to help. She mowed her lawn in tight shorts right before she saw the bumps.     Patient Active Problem List   Diagnosis Date Noted   Chlamydia 05/18/2022   Retained placenta 02/11/2020   Abnormal uterine bleeding 02/11/2020   Anxiety attack 01/06/2020   Anxiety and depression 11/07/2019   Sleep disorder 03/12/2019   Headache disorder 09/11/2018   Memory difficulty 09/11/2018   Overweight 06/21/2017   Restless leg syndrome 03/01/2017   H/O:  depression 03/27/2014    Past Surgical History:  Procedure Laterality Date   BRONCHOSCOPY  2016   DILATION AND EVACUATION N/A 02/11/2020   Procedure: DILATATION AND EVACUATION;  Surgeon: Arloa Lamar SQUIBB, MD;  Location: [facility] ORS;  Service: Gynecology;  Laterality: N/A;   TONSILLECTOMY     WISDOM TOOTH EXTRACTION      Family History  Adopted: Yes  Problem Relation Age of Onset   Throat cancer Father     Social History   Socioeconomic History   Marital status: Single    Spouse name: Not on file   Number of children: Not on file   Years of education: Not on file   Highest education level: Not on file  Occupational History   Not on file  Tobacco Use   Smoking status: Never   Smokeless tobacco: Never  Substance and Sexual Activity   Alcohol use: Yes    Comment: occas   Drug use: No   Sexual activity: Yes    Birth control/protection: None  Other Topics Concern   Not on file  Social History Narrative   Not on file   Social Drivers of Health   Financial Resource Strain: Not on File (06/15/2022)   Received from [employer]    Financial Resource Strain: 0  Food Insecurity: Not on File (10/13/2022)  Received from [employer]    Food: 0  Transportation Needs: Not on File (06/15/2022)   Received from [employer] [facility]eds    Transportation: 0  Physical Activity: Not on File (06/15/2022)   Received from [facility]   Physical Activity    Physical Activity: 0  Stress: Not on File (06/15/2022)   Received from [facility]   Stress    Stress: 0  Social Connections: Not on File (10/03/2022)   Received from [employer]   Social Connections    Connectedness: 0  Intimate Partner Violence: Not on file    Outpatient Medications Prior to Visit  Medication Sig Dispense Refill   aspirin 81 MG chewable tablet Chew by mouth daily.     Multiple Vitamin (MULTIVITAMIN) tablet Take 1 tablet by mouth daily.     Prenatal Vit-Fe Fumarate-FA  (MULTIVITAMIN-PRENATAL) 27-0.8 MG TABS tablet Take 1 tablet by mouth daily at 12 noon.     No facility-administered medications prior to visit.      ROS:  Review of Systems see HPI    OBJECTIVE:   Vitals:  BP 113/77 (BP Location: Left Arm, Patient Position: Sitting, Cuff Size: Normal)   Pulse 73   Ht 5' 4 (1.626 m)   Wt 181 lb 3.2 oz (82.2 kg)   LMP 08/04/2023 (Approximate)   BMI 31.10 kg/m   Physical Exam Constitutional:      Appearance: Normal appearance.  Cardiovascular:     Rate and Rhythm: Normal rate.  Abdominal:     General: There is no distension.     Palpations: Abdomen is soft. There is no mass.     Tenderness: There is no abdominal tenderness. There is no guarding or rebound.  Genitourinary:    General: Normal vulva.     Comments: SSE: cervix pink, no lesions, scant clear discharge present, no bleeding  Bimanual exam: non gravid uterus, non tender, no masses, adnexa non tender no masses not enlarged, no CMT  Musculoskeletal:        General: Normal range of motion.     Cervical back: Normal range of motion.  Skin:    General: Skin is warm.     Comments: Few scattered red macules on both thighes  Neurological:     Mental Status: She is alert.     Results: Results for orders placed or performed in visit on 08/14/23 (from the past 24 hours)  POCT urine pregnancy     Status: None   Collection Time: 08/14/23  2:49 PM  Result Value Ref Range   Preg Test, Ur Negative Negative     Assessment/Plan: Lower abdominal pain - Plan: Comprehensive metabolic panel with GFR, CBC w/Diff/Platelet, POCT urine pregnancy, Ambulatory referral to Family Practice  Screening examination for venereal disease - Plan: Cervicovaginal ancillary only, HEP, RPR, HIV Panel  -Reviewed lower abdominal pain could be related to intestines, recommend further evaluation by another provider, pt does not want to see her PCP. Referral for new PCP placed. You may want to consider your  diet.   -Please schedule an appointment with Dr Janit to review evaluation for endo.  I am not able to fully inform you on this procedure nor can I schedule the procedure, it is important you meet with the Surgeon that would perform this.   -Based on your exam today, low suspicion for PID, swab collected    No orders of the defined types were placed in this encounter.    JINNIE HERO Sandy Haye,  CNM 08/14/2023 4:57 PM

## 2023-08-15 LAB — CBC WITH DIFFERENTIAL/PLATELET
Basophils Absolute: 0.1 x10E3/uL (ref 0.0–0.2)
Basos: 1 %
EOS (ABSOLUTE): 0 x10E3/uL (ref 0.0–0.4)
Eos: 1 %
Hematocrit: 39.6 % (ref 34.0–46.6)
Hemoglobin: 12.3 g/dL (ref 11.1–15.9)
Immature Grans (Abs): 0 x10E3/uL (ref 0.0–0.1)
Immature Granulocytes: 0 %
Lymphocytes Absolute: 1.4 x10E3/uL (ref 0.7–3.1)
Lymphs: 24 %
MCH: 28.6 pg (ref 26.6–33.0)
MCHC: 31.1 g/dL — ABNORMAL LOW (ref 31.5–35.7)
MCV: 92 fL (ref 79–97)
Monocytes Absolute: 0.6 x10E3/uL (ref 0.1–0.9)
Monocytes: 9 %
Neutrophils Absolute: 3.9 x10E3/uL (ref 1.4–7.0)
Neutrophils: 65 %
Platelets: 228 x10E3/uL (ref 150–450)
RBC: 4.3 x10E6/uL (ref 3.77–5.28)
RDW: 13.2 % (ref 11.7–15.4)
WBC: 6 x10E3/uL (ref 3.4–10.8)

## 2023-08-15 LAB — COMPREHENSIVE METABOLIC PANEL WITH GFR
ALT: 10 IU/L (ref 0–32)
AST: 15 IU/L (ref 0–40)
Albumin: 3.6 g/dL — ABNORMAL LOW (ref 3.9–4.9)
Alkaline Phosphatase: 34 IU/L — ABNORMAL LOW (ref 44–121)
BUN/Creatinine Ratio: 12 (ref 9–23)
BUN: 10 mg/dL (ref 6–24)
Bilirubin Total: 0.2 mg/dL (ref 0.0–1.2)
CO2: 21 mmol/L (ref 20–29)
Calcium: 8.3 mg/dL — ABNORMAL LOW (ref 8.7–10.2)
Chloride: 107 mmol/L — ABNORMAL HIGH (ref 96–106)
Creatinine, Ser: 0.81 mg/dL (ref 0.57–1.00)
Globulin, Total: 1.8 g/dL (ref 1.5–4.5)
Glucose: 70 mg/dL (ref 70–99)
Potassium: 4.5 mmol/L (ref 3.5–5.2)
Sodium: 140 mmol/L (ref 134–144)
Total Protein: 5.4 g/dL — ABNORMAL LOW (ref 6.0–8.5)
eGFR: 94 mL/min/1.73 (ref >59)

## 2023-08-15 LAB — HEP, RPR, HIV PANEL
HIV Screen 4th Generation wRfx: NONREACTIVE
Hepatitis B Surface Ag: NEGATIVE
RPR Ser Ql: NONREACTIVE

## 2023-08-16 LAB — CERVICOVAGINAL ANCILLARY ONLY
Bacterial Vaginitis (gardnerella): POSITIVE — AB
Candida Glabrata: NEGATIVE
Candida Vaginitis: NEGATIVE
Chlamydia: NEGATIVE
Comment: NEGATIVE
Comment: NEGATIVE
Comment: NEGATIVE
Comment: NEGATIVE
Comment: NEGATIVE
Comment: NORMAL
Neisseria Gonorrhea: NEGATIVE
Trichomonas: NEGATIVE

## 2023-08-17 ENCOUNTER — Other Ambulatory Visit: Payer: Self-pay | Admitting: Licensed Practical Nurse

## 2023-08-17 ENCOUNTER — Ambulatory Visit: Payer: Self-pay

## 2023-08-17 DIAGNOSIS — B9689 Other specified bacterial agents as the cause of diseases classified elsewhere: Secondary | ICD-10-CM

## 2023-08-17 MED ORDER — METRONIDAZOLE 500 MG PO TABS: 500.0000 mg | ORAL_TABLET | Freq: Two times a day (BID) | ORAL | 0 refills | Status: DC

## 2023-08-17 NOTE — Progress Notes (Signed)
 Pt reported abnormal discharge at office visit, swab shows BV. Script for Flagyl  sent to pharmacy on file. Mychart message sent.  Jinnie Cookey, CNM  [facility] 08/17/23  9:38 AM

## 2023-09-14 ENCOUNTER — Ambulatory Visit: Admitting: Obstetrics and Gynecology

## 2023-09-14 ENCOUNTER — Encounter: Payer: Self-pay | Admitting: Obstetrics and Gynecology

## 2023-09-14 VITALS — BP 114/78 | HR 58 | Ht 64.0 in | Wt 180.3 lb

## 2023-09-14 DIAGNOSIS — N979 Female infertility, unspecified: Secondary | ICD-10-CM | POA: Diagnosis not present

## 2023-09-14 DIAGNOSIS — R1030 Lower abdominal pain, unspecified: Secondary | ICD-10-CM
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
## 2023-10-06 ENCOUNTER — Telehealth: Payer: Self-pay

## 2023-10-06 NOTE — Telephone Encounter (Signed)
 Patient called and she is requesting HCG labs done, because she has had two chemical pregnancies. I told her that I will send message to you to place orders for those labs. Please advise.

## 2023-10-08 ENCOUNTER — Other Ambulatory Visit: Payer: Self-pay | Admitting: Licensed Practical Nurse

## 2023-10-08 DIAGNOSIS — N912 Amenorrhea, unspecified: Secondary | ICD-10-CM

## 2023-10-08 DIAGNOSIS — O200 Threatened abortion: Secondary | ICD-10-CM

## 2023-10-08 NOTE — Progress Notes (Signed)
 Pt requesting serial beta's for hx of chemical pregnancies.  Orders placed  Jinnie Cookey, [place]  [facility] 10/08/23  12:55 PM

## 2023-10-24 ENCOUNTER — Other Ambulatory Visit: Payer: Self-pay | Admitting: Licensed Practical Nurse

## 2023-10-24 ENCOUNTER — Other Ambulatory Visit: Payer: Self-pay

## 2023-10-24 DIAGNOSIS — N912 Amenorrhea, unspecified: Secondary | ICD-10-CM

## 2023-10-24 DIAGNOSIS — O200 Threatened abortion: Secondary | ICD-10-CM

## 2023-10-24 NOTE — Progress Notes (Signed)
 TC to [place],  I am following up on a mychart message I sent, you requested serial beta's, I see they have not been done. Twisha reports that nobody called her to let her know labs were ordered nor did she see her mychart message regarding scheduling labs. Khloei is about [redacted]wks pregnant, will forego beta testing and have confirmation US .  US  ordered. Anjeli instructed to call office to schedule US .  Jinnie Cookey, CNM   OB-GYN 10/24/23  2:23 PM

## 2023-10-26 ENCOUNTER — Ambulatory Visit
Admission: RE | Admit: 2023-10-26 | Discharge: 2023-10-26 | Disposition: A | Discharge status: Home / Self Care | Source: Ambulatory Visit | Attending: Licensed Practical Nurse | Admitting: Licensed Practical Nurse

## 2023-10-26 DIAGNOSIS — N912 Amenorrhea, unspecified: Secondary | ICD-10-CM | POA: Diagnosis present

## 2023-10-31 ENCOUNTER — Other Ambulatory Visit: Payer: Self-pay | Admitting: Licensed Practical Nurse

## 2023-10-31 DIAGNOSIS — N912 Amenorrhea, unspecified: Secondary | ICD-10-CM

## 2023-11-06 ENCOUNTER — Ambulatory Visit: Payer: Self-pay | Admitting: Licensed Practical Nurse

## 2023-11-23 LAB — PANORAMA PRENATAL TEST FULL PANEL:PANORAMA TEST PLUS 5 ADDITIONAL MICRODELETIONS: FETAL FRACTION: 7.4

## 2023-12-05 NOTE — Progress Notes (Addendum)
 "                                                                 [facility]                                                                        Return Prenatal Note  Assessment/Plan:  Plan 38 y.o. [id number] at [redacted]w[redacted]d presents for a follow-up OB visit for high risk pregnancy.   Assessment & Plan History of Preterm premature rupture of membranes (PPROM) with unknown onset of labor (HHS-HCC) Discussed vaginal progesterone , hx indicated cerclage, and cervical lengths with patient. She would like the vaginal progesterone  at 16 and serial cervical lengths. Continues to decline Hx indicated cerclage.   AMA (advanced maternal age) multigravida 35+, unspecified trimester (HHS-HCC)  Antenatal testing for maternal age >= 73 at EDD in the absence of maternal or fetal complications Growth US  at 32-34 wks Weekly NST at 36 wks Deliver by [redacted]w[redacted]d    Orders:   US  OB Targeted 2nd/3rd Tri Single Gestation; Future  History of preterm delivery, currently pregnant (HHS-HCC) Discussed vaginal progesterone , hx indicated cerclage, and cervical lengths with patient. She would like the vaginal progesterone  at 16 and serial cervical lengths. Continues to decline Hx indicated cerclage.  Orders:   progesterone  (PROMETRIUM ) 200 MG capsule; Insert 1 capsule per vaginal at bedtime  Supervision of high risk pregnancy, antepartum (HHS-HCC) No LOF or VB.   PTL  and PreE precautions reviewed.     Cough, unspecified type Cough and drainage for the last month.  Was seen by PCP with negative respiratory panel.  Has ENT appt next week.  Having chest pressure with laying down. About 1 month with onset of cough.  Worse when laying on back or side.  Improved while laying on stomach. No heart palpitations or chest pain.  Encouraged Mucinex, Zyrtec, Humidifier. Pt requested Rx for Flonase.  Has been using her child's and states it may be helping a bit.  Orders:   fluticasone propionate (FLONASE) 50  mcg/actuation nasal spray; 1 spray into each nostril daily.   No future appointments.    US  in 4, 6, 8, 10 weeks Anatomy US  in 6-8 weeks ROB in 4 weeks   Subjective:      38 y.o. [id number] at [redacted]w[redacted]d presents for follow-up OB visit for high risk pregnancy. Patient denies contractions, leakage of fluid, or vaginal bleeding.    Objective:     The following portions of the patient's history were reviewed and updated as appropriate: allergies, current medications, past obstetric history, past family history, past medical history, past social history, past surgical history, and problem list  Flowsheet Vitals: Temp: 36.8 C (98.2 F) Pulse: 70 BP: 104/67 Movement: Absent Loss of Fluid: No Bleeding: No Contractions: Not present Total weight gain: -0.408 kg (-14.4 oz)  General Appearance No acute distress, well appearing, and well nourished.  Pulmonary Normal work of breathing. Lungs clear bilaterally on auscultation   Cardiovascular Normal rate. Edema none.  Gastronintestinal Abdomen soft, gravid, no rebound or guarding.  Genitourinary SVE deferred    Muskuloskeletal Normal gait, grossly normal range of motion  Neurologic Alert and oriented to person, place, and time  Psychiatric Integumentary Mood and affect within normal limits Skin is warm, dry, no rash noted  This encounter was coded based on time. I personally spent 30 minutes face-to-face and non-face-to-face in the care of this patient, which includes all pre, intra, and post visit time on the date of service. SABRA    "

## 2024-01-05 NOTE — Progress Notes (Signed)
 "                                                                 [facility]                                                                        Return Prenatal Note  Assessment/Plan:  Plan 38 y.o. [id number] at [redacted]w[redacted]d presents for a follow-up OB visit for high risk pregnancy.   AMA (advanced maternal age) multigravida 35+, unspecified trimester (HHS-HCC) Normal NIPT  Antenatal testing for maternal age >= 9 at EDD in the absence of maternal or fetal complications Growth US  at 32-34 wks Weekly NST at 36 wks Deliver by [redacted]w[redacted]d    History of depression Denis current symptoms.  Reports mood is good  Supervision of high risk pregnancy, antepartum (HHS-HCC) Started ASA  Denies VB, LOF Reviewed PTL precautions  Anatomy scheduled 1/15 No questions or concerns today, encouraged her to reach out if this changes.   History of preterm delivery, currently pregnant (HHS-HCC) Discussed vaginal progesterone , hx indicated cerclage, and cervical lengths with patient.  She has started the vag prog.  Continues to decline Hx indicated cerclage.  Declines further counseling.     Torn ACL -Torn ACL pending repair (Torn January 2025). Sometimes loses balance and falls. -Following with [facility] -Advised that it is safe for her to have surgery in the second trimester (mid to late November) -Advised to let us  know if her orthopedic surgeons need further documentation - She has decided that she is deferring repair until after her birth   History of Preterm premature rupture of membranes (PPROM) with unknown onset of labor (HHS-HCC) Reviewed precautions and s/s of PTL.  Pt feels comfortable with when to seek medical attention   Acute tension headache Reports that HAs have improved.     Future Appointments  Date Time Provider Department Center  01/16/2024  9:00 AM [facility] US  RM 2 [facility] - MFM at  02/01/2024 10:00 AM [facility] US  RM 3 [facility] - [facility] at  02/01/2024  12:30 PM Rubbie Manuelita Maffucci, NP [facility]  02/13/2024 10:00 AM [facility] US  RM 2 [facility] - [facility]FM at     [facility] 1/16   Subjective:      38 y.o. [id number] at [redacted]w[redacted]d presents for follow-up OB visit for high risk pregnancy. Patient reports not yet feeling fetal movement and denies contractions, leakage of fluid, or vaginal bleeding.    Objective:      Flowsheet Vitals: Temp: 36.8 C (98.2 F) Pulse: 77 BP: 116/66 Movement: Present Loss of Fluid: No Bleeding: No Contractions: Not present Fetal Heart Rate: us  Total weight gain: -1.27 kg (-2 lb 12.8 oz)  General Appearance No acute distress, well appearing, and well nourished.  Pulmonary Normal work of breathing.  Cardiovascular Normal rate. Edema none.    Gastronintestinal Abdomen soft, gravid, no rebound or guarding.  Genitourinary SVE deferred    Muskuloskeletal Normal gait, grossly normal  range of motion  Neurologic Alert and oriented to person, place, and time  Psychiatric Integumentary Mood and affect within normal limits Skin is warm, dry, no rash noted  This encounter was coded based on time. I personally spent 25 minutes face-to-face and non-face-to-face in the care of this patient, which includes all pre, intra, and post visit time on the date of service. SABRA    "

## 2024-01-19 ENCOUNTER — Encounter (HOSPITAL_COMMUNITY): Payer: Self-pay | Admitting: Obstetrics and Gynecology

## 2024-01-19 ENCOUNTER — Emergency Department
Admission: EM | Admit: 2024-01-19 | Discharge: 2024-01-19 | Discharge status: Against Medical Advice | Source: Home / Self Care

## 2024-01-19 ENCOUNTER — Inpatient Hospital Stay (HOSPITAL_COMMUNITY)

## 2024-01-19 ENCOUNTER — Inpatient Hospital Stay (HOSPITAL_COMMUNITY)
Admission: AD | Admit: 2024-01-19 | Discharge: 2024-01-19 | Disposition: A | Discharge status: Home / Self Care | Attending: Family Medicine | Admitting: Family Medicine

## 2024-01-19 DIAGNOSIS — Z3A18 18 weeks gestation of pregnancy: Secondary | ICD-10-CM

## 2024-01-19 DIAGNOSIS — O09292 Supervision of pregnancy with other poor reproductive or obstetric history, second trimester: Secondary | ICD-10-CM | POA: Insufficient documentation

## 2024-01-19 DIAGNOSIS — O209 Hemorrhage in early pregnancy, unspecified: Secondary | ICD-10-CM | POA: Diagnosis present

## 2024-01-19 DIAGNOSIS — O26892 Other specified pregnancy related conditions, second trimester: Secondary | ICD-10-CM

## 2024-01-19 DIAGNOSIS — O09299 Supervision of pregnancy with other poor reproductive or obstetric history, unspecified trimester: Secondary | ICD-10-CM

## 2024-01-19 DIAGNOSIS — O4692 Antepartum hemorrhage, unspecified, second trimester: Secondary | ICD-10-CM | POA: Diagnosis not present

## 2024-01-19 DIAGNOSIS — O09522 Supervision of elderly multigravida, second trimester: Secondary | ICD-10-CM

## 2024-01-19 DIAGNOSIS — O23592 Infection of other part of genital tract in pregnancy, second trimester: Secondary | ICD-10-CM | POA: Diagnosis not present

## 2024-01-19 DIAGNOSIS — N760 Acute vaginitis: Secondary | ICD-10-CM

## 2024-01-19 DIAGNOSIS — O09212 Supervision of pregnancy with history of pre-term labor, second trimester: Secondary | ICD-10-CM | POA: Insufficient documentation

## 2024-01-19 DIAGNOSIS — B9689 Other specified bacterial agents as the cause of diseases classified elsewhere: Secondary | ICD-10-CM

## 2024-01-19 DIAGNOSIS — Z113 Encounter for screening for infections with a predominantly sexual mode of transmission: Secondary | ICD-10-CM | POA: Diagnosis present

## 2024-01-19 LAB — WET PREP, GENITAL
Sperm: NONE SEEN
Trich, Wet Prep: NONE SEEN
WBC, Wet Prep HPF POC: >=10 — AB
Yeast Wet Prep HPF POC: NONE SEEN

## 2024-01-19 MED ORDER — METRONIDAZOLE 500 MG PO TABS: 500.0000 mg | ORAL_TABLET | Freq: Two times a day (BID) | ORAL | 0 refills | Status: AC
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
## 2024-01-19 NOTE — MAU Note (Signed)
 Pt says she is 19 weeks and gets [facility] at MFM  in [facility]. - says is High Risk -  Says has VB - started 6pm- brown - on underwear . No pain . Now in Triage - on underwear - less than spotting. No pain .  Last sex- 6 weeks ago.

## 2024-01-22 LAB — GC/CHLAMYDIA PROBE AMP (~~LOC~~) NOT AT ARMC
Chlamydia: NEGATIVE
Comment: NEGATIVE
Comment: NORMAL
Neisseria Gonorrhea: NEGATIVE
